# Patient Record
Sex: Female | Born: 1965 | Race: Black or African American | Hispanic: No | Marital: Single | Smoking: Never smoker
Health system: Southern US, Community
[De-identification: ages and names within clinical notes are randomized; demographics above are authoritative.]

## PROBLEM LIST (undated history)

## (undated) DIAGNOSIS — F259 Schizoaffective disorder, unspecified: Secondary | ICD-10-CM

## (undated) DIAGNOSIS — F419 Anxiety disorder, unspecified: Secondary | ICD-10-CM

## (undated) DIAGNOSIS — F1910 Other psychoactive substance abuse, uncomplicated: Secondary | ICD-10-CM

## (undated) DIAGNOSIS — M419 Scoliosis, unspecified: Secondary | ICD-10-CM

## (undated) DIAGNOSIS — J45909 Unspecified asthma, uncomplicated: Secondary | ICD-10-CM

## (undated) DIAGNOSIS — F250 Schizoaffective disorder, bipolar type: Secondary | ICD-10-CM

## (undated) HISTORY — PX: ABDOMINAL HYSTERECTOMY: SHX81

---

## 2016-05-15 DIAGNOSIS — Z139 Encounter for screening, unspecified: Secondary | ICD-10-CM

## 2016-06-14 LAB — GLUCOSE, POCT (MANUAL RESULT ENTRY): POC GLUCOSE: 101 mg/dL — AB (ref 70–99)

## 2016-06-14 NOTE — Congregational Nurse Program (Unsigned)
Congregational Nurse Program Note  Date of Encounter: 05/15/2016  Past Medical History: No past medical history on file.  Encounter Details:    Requested blood sugar check.  CBG 101 - random draw.  Does not have a HCP nor insurance.  Encouraged to make appointment with Lavinia SharpsMary Ann Placey [facility] at [facility]

## 2016-07-03 DIAGNOSIS — Z139 Encounter for screening, unspecified: Secondary | ICD-10-CM

## 2016-07-06 LAB — GLUCOSE, POCT (MANUAL RESULT ENTRY): POC GLUCOSE: 139 mg/dL — AB (ref 70–99)

## 2016-07-06 NOTE — Congregational Nurse Program (Unsigned)
Congregational Nurse Program Note  Date of Encounter: 07/03/2016  Past Medical History: No past medical history on file.  Encounter Details:     CNP Questionnaire - 07/03/16 1501      Patient Demographics   Is this a new or existing patient? Existing   Patient is considered a/an Not Applicable   Race African-American/Black     Patient Assistance   Location of Patient Assistance Not Applicable   Patient's financial/insurance status Low Income;[facility]/Care Connects   Uninsured Patient Yes   Interventions Not Applicable   Patient referred to apply for the following financial assistance Not Applicable   Food insecurities addressed Provided food supplies   Transportation assistance No   Assistance securing medications No   Educational health offerings Navigating the healthcare system;Diabetes     Encounter Details   Primary purpose of visit Education/Health Concerns;Navigating the Healthcare System   Was an Emergency Department visit averted? Not Applicable   Does patient have a medical provider? No   Patient referred to Clinic   Was a mental health screening completed? (GAINS tool) No   Does patient have dental issues? No   Does patient have vision issues? No   Does your patient have an abnormal blood pressure today? No   Since previous encounter, have you referred patient for abnormal blood pressure that resulted in a new diagnosis or medication change? No   Does your patient have an abnormal blood glucose today? No   Since previous encounter, have you referred patient for abnormal blood glucose that resulted in a new diagnosis or medication change? No   Was there a life-saving intervention made? No      B/p and CBG check.

## 2016-07-08 DIAGNOSIS — Z139 Encounter for screening, unspecified: Secondary | ICD-10-CM

## 2016-07-09 LAB — GLUCOSE, POCT (MANUAL RESULT ENTRY): POC Glucose: 137 mg/dl — AB (ref 70–99)

## 2016-07-09 NOTE — Congregational Nurse Program (Unsigned)
Congregational Nurse Program Note  Date of Encounter: 07/08/2016  Past Medical History: No past medical history on file.  Encounter Details:     CNP Questionnaire - 07/08/16 1121      Patient Demographics   Is this a new or existing patient? Existing   Patient is considered a/an Not Applicable   Race African-American/Black     Patient Assistance   Location of Patient Assistance Not Applicable   Patient's financial/insurance status Low Income;[facility]/Care Connects   Uninsured Patient Yes   Interventions Not Applicable   Patient referred to apply for the following financial assistance Not Applicable   Food insecurities addressed Provided food supplies   Transportation assistance No   Assistance securing medications No   Educational health offerings Navigating the healthcare system;Diabetes     Encounter Details   Primary purpose of visit Education/Health Concerns;Navigating the Healthcare System   Was an Emergency Department visit averted? Not Applicable   Does patient have a medical provider? No   Patient referred to Not Applicable   Was a mental health screening completed? (GAINS tool) No   Does patient have dental issues? No   Does patient have vision issues? No   Does your patient have an abnormal blood pressure today? No   Since previous encounter, have you referred patient for abnormal blood pressure that resulted in a new diagnosis or medication change? No   Does your patient have an abnormal blood glucose today? No   Since previous encounter, have you referred patient for abnormal blood glucose that resulted in a new diagnosis or medication change? No   Was there a life-saving intervention made? No      CBG check.  Client states she is "hungry all the time", is "thirsty a lot of the time" and feels tired.  Encouraged client to return to the clinic on Thursday at 8 am for a fasting CBG.  Client agreed

## 2016-07-10 DIAGNOSIS — Z139 Encounter for screening, unspecified: Secondary | ICD-10-CM

## 2016-07-18 LAB — GLUCOSE, POCT (MANUAL RESULT ENTRY): POC Glucose: 90 mg/dl (ref 70–99)

## 2016-07-18 NOTE — Congregational Nurse Program (Unsigned)
Congregational Nurse Program Note  Date of Encounter: 07/10/2016  Past Medical History: No past medical history on file.  Encounter Details:     CNP Questionnaire - 07/10/16 2047      Patient Demographics   Is this a new or existing patient? Existing   Patient is considered a/an Not Applicable   Race African-American/Black     Patient Assistance   Location of Patient Assistance Not Applicable   Patient's financial/insurance status Low Income;[facility] Card/Care Connects   Uninsured Patient Yes   Interventions Not Applicable   Patient referred to apply for the following financial assistance Not Applicable   Food insecurities addressed Provided food supplies   Transportation assistance No   Assistance securing medications No   Educational health offerings Navigating the healthcare system;Diabetes     Encounter Details   Primary purpose of visit Education/Health Concerns;Navigating the Healthcare System   Was an Emergency Department visit averted? Not Applicable   Does patient have a medical provider? No   Patient referred to Not Applicable   Was a mental health screening completed? (GAINS tool) No   Does patient have dental issues? No   Does patient have vision issues? No   Does your patient have an abnormal blood pressure today? No   Since previous encounter, have you referred patient for abnormal blood pressure that resulted in a new diagnosis or medication change? No   Does your patient have an abnormal blood glucose today? No   Since previous encounter, have you referred patient for abnormal blood glucose that resulted in a new diagnosis or medication change? No   Was there a life-saving intervention made? No      CBG check.  Fasting CBG 90

## 2016-08-09 ENCOUNTER — Encounter (HOSPITAL_COMMUNITY): Payer: Self-pay | Admitting: Emergency Medicine

## 2016-08-09 ENCOUNTER — Emergency Department (HOSPITAL_COMMUNITY)
Admission: EM | Admit: 2016-08-09 | Discharge: 2016-08-09 | Disposition: A | Discharge status: Home / Self Care | Payer: Self-pay | Attending: Emergency Medicine | Admitting: Emergency Medicine

## 2016-08-09 DIAGNOSIS — F1410 Cocaine abuse, uncomplicated: Secondary | ICD-10-CM | POA: Insufficient documentation

## 2016-08-09 HISTORY — DX: Other psychoactive substance abuse, uncomplicated: F19.10

## 2016-08-09 LAB — COMPREHENSIVE METABOLIC PANEL
ALK PHOS: 49 U/L (ref 38–126)
ALT: 25 U/L (ref 14–54)
AST: 43 U/L — AB (ref 15–41)
Albumin: 4.1 g/dL (ref 3.5–5.0)
Anion gap: 7 (ref 5–15)
BUN: 19 mg/dL (ref 6–20)
CALCIUM: 9.7 mg/dL (ref 8.9–10.3)
CHLORIDE: 105 mmol/L (ref 101–111)
CO2: 27 mmol/L (ref 22–32)
CREATININE: 1.26 mg/dL — AB (ref 0.44–1.00)
GFR calc Af Amer: 56 mL/min — ABNORMAL LOW (ref >60)
GFR calc non Af Amer: 48 mL/min — ABNORMAL LOW (ref >60)
Glucose, Bld: 98 mg/dL (ref 65–99)
Potassium: 3.4 mmol/L — ABNORMAL LOW (ref 3.5–5.1)
SODIUM: 139 mmol/L (ref 135–145)
Total Bilirubin: 0.7 mg/dL (ref 0.3–1.2)
Total Protein: 7.9 g/dL (ref 6.5–8.1)

## 2016-08-09 LAB — CBC
HEMATOCRIT: 41.3 % (ref 36.0–46.0)
HEMOGLOBIN: 13.9 g/dL (ref 12.0–15.0)
MCH: 30 pg (ref 26.0–34.0)
MCHC: 33.7 g/dL (ref 30.0–36.0)
MCV: 89.2 fL (ref 78.0–100.0)
PLATELETS: 206 10*3/uL (ref 150–400)
RBC: 4.63 MIL/uL (ref 3.87–5.11)
RDW: 12.9 % (ref 11.5–15.5)
WBC: 6.9 10*3/uL (ref 4.0–10.5)

## 2016-08-09 LAB — ETHANOL

## 2016-08-09 NOTE — ED Triage Notes (Signed)
Pt. Stated, Im here for detox of crack. Last used last night. Had 50 dollars worth.Been doing 23 years . [facility]

## 2016-08-09 NOTE — ED Provider Notes (Signed)
[facility]-EMERGENCY DEPT Provider Note   CSN: [id number] Arrival date & time: 08/09/16  1300     History   Chief Complaint Chief Complaint  Patient presents with  . Addiction Problem  . Medical Clearance    HPI  Blood pressure 104/81, pulse 90, temperature 98.1 F (36.7 C), temperature source Oral, resp. rate 18, height 5\' 4"  (1.626 m), weight 51.3 kg, last menstrual period 10/10/2011, SpO2 95 %.  Amanda Keith is a 50 y.o. female questing detox from crack cocaine, she was clean for 4 years and she "fell off the wagon" 3 days ago. She doesn't drink any alcohol and doesn't use any other drugs. She denies any depression, suicidal ideation, homicidal ideation, auditory or visual hallucinations, chest pain, abdominal pain, nausea vomiting. Patient states that she recently moved to the area and does not have a primary care physician.  HPI  Past Medical History:  Diagnosis Date  . Abuse, drug or alcohol     There are no active problems to display for this patient.   History reviewed. No pertinent surgical history.  OB History    No data available       Home Medications    Prior to Admission medications   Not on File    Family History No family history on file.  Social History Social History  Substance Use Topics  . Smoking status: Not on file  . Smokeless tobacco: Never Used  . Alcohol use No     Allergies   Review of patient's allergies indicates not on file.   Review of Systems Review of Systems  10 systems reviewed and found to be negative, except as noted in the HPI.   Physical Exam Updated Vital Signs BP 104/81 (BP Location: Right Arm)   Pulse 90   Temp 98.1 F (36.7 C) (Oral)   Resp 18   Ht 5\' 4"  (1.626 m)   Wt 51.3 kg   LMP 10/10/2011   SpO2 95%   BMI 19.40 kg/m   Physical Exam  Constitutional: She is oriented to person, place, and time. She appears well-developed and well-nourished. No distress.  HENT:  Head: Normocephalic.  Eyes:  Conjunctivae and EOM are normal.  Cardiovascular: Normal rate.   Pulmonary/Chest: Effort normal. No stridor.  Musculoskeletal: Normal range of motion.  Neurological: She is alert and oriented to person, place, and time.  Psychiatric: She has a normal mood and affect. Her speech is normal and behavior is normal. Thought content normal.  Nursing note and vitals reviewed.    ED Treatments / Results  Labs (all labs ordered are listed, but only abnormal results are displayed) Labs Reviewed  COMPREHENSIVE METABOLIC PANEL - Abnormal; Notable for the following:       Result Value   Potassium 3.4 (*)    Creatinine, Ser 1.26 (*)    AST 43 (*)    GFR calc non Af Amer 48 (*)    GFR calc Af Amer 56 (*)    All other components within normal limits  ETHANOL  CBC    EKG  EKG Interpretation None       Radiology No results found.  Procedures Procedures (including critical care time)  Medications Ordered in ED Medications - No data to display   Initial Impression / Assessment and Plan / ED Course  I have reviewed the triage vital signs and the nursing notes.  Pertinent labs & imaging results that were available during my care of the patient were reviewed by me  and considered in my medical decision making (see chart for details).  Clinical Course   Vitals:   08/09/16 1321 08/09/16 1322  BP: 104/81   Pulse: 90   Resp: 18   Temp: 98.1 F (36.7 C)   TempSrc: Oral   SpO2: 95%   Weight:  51.3 kg  Height:  5\' 4"  (1.626 m)     Amanda BlazerSandra Keith is 50 y.o. female requesting detox from crack cocaine, she has been using for several days, she was clean for 3 years before that. No other alcohol or drug use. No other psychiatric issues. Patient afebrile, overall very well appearing. Unfortunately, we cannot offer her inpatient cocaine detox. I have given her a resource guide and asked her friend to help her make phone calls to get the appropriate detox facility.  Evaluation does not show  pathology that would require ongoing emergent intervention or inpatient treatment. Pt is hemodynamically stable and mentating appropriately. Discussed findings and plan with patient/guardian, who agrees with care plan. All questions answered. Return precautions discussed and outpatient follow up given.      Final Clinical Impressions(s) / ED Diagnoses   Final diagnoses:  Cocaine abuse      Amanda Keith [place], PA-C 08/09/16 1551    Arby BarretteMarcy Pfeiffer, MD 08/09/16 2306

## 2016-08-09 NOTE — Care Management Note (Signed)
Case Management Note  Patient Details  Name: Laurine BlazerSandra Babin MRN: [id number] Date of Birth: [birth year]  Subjective/Objective:    50 y.o F seen in the ED 08/09/2016 for Cocaine detox. [place] [facility] [facility] with [facility]. Pt reports she does have [employer].     Action/Plan: [facility] and instructed to call for appt asap. Pt was also [place] [facility] [place] No questions or concerns. Aware hat open hours for clinic are 08:30 Thursdays. No further CM needs at this time              Expected Discharge Date:                  Expected Discharge Plan:  Home/Self Care  In-House Referral:     Discharge planning Services  CM Consult  Post Acute Care Choice:  NA Choice offered to:  Patient  DME Arranged:  N/A DME Agency:  NA  HH Arranged:  NA HH Agency:  NA  Status of Service:  Completed, signed off  If discussed at Long Length of Stay Meetings, dates discussed:    Additional Comments:  Yvone NeuCrutchfield, Amier Hoyt M, RN 08/09/2016, 4:11 PM

## 2016-08-12 ENCOUNTER — Encounter: Payer: Self-pay | Admitting: Pediatric Intensive Care

## 2016-08-12 DIAGNOSIS — Z139 Encounter for screening, unspecified: Secondary | ICD-10-CM

## 2016-08-15 NOTE — Congregational Nurse Program (Signed)
Congregational Nurse Program Note  Date of Encounter: 08/12/2016  Past Medical History: Past Medical History:  Diagnosis Date  . Abuse, drug or alcohol     Encounter Details:     CNP Questionnaire - 08/12/16 1130      Patient Demographics   Is this a new or existing patient? Existing   Patient is considered a/an Not Applicable   Race African-American/Black     Patient Assistance   Location of Patient Assistance [facility]   Patient's financial/insurance status Low Income;[place] Card/Care Connects   Uninsured Patient Yes   Interventions Not Applicable   Patient referred to apply for the following financial assistance Not Applicable   Food insecurities addressed Not Applicable   Transportation assistance No   Assistance securing medications No   Educational health offerings Navigating the healthcare system;Other     Encounter Details   Primary purpose of visit Education/Health Concerns;Navigating the Healthcare System   Was an Emergency Department visit averted? Not Applicable   Does patient have a medical provider? No   Patient referred to Not Applicable   Was a mental health screening completed? (GAINS tool) No   Does patient have dental issues? No   Does patient have vision issues? No   Does your patient have an abnormal blood pressure today? No   Since previous encounter, have you referred patient for abnormal blood pressure that resulted in a new diagnosis or medication change? No   Does your patient have an abnormal blood glucose today? No   Since previous encounter, have you referred patient for abnormal blood glucose that resulted in a new diagnosis or medication change? No   Was there a life-saving intervention made? No      Client states that she has been followed by CN for "pre-diabetes" and would like BP, BG check. Client also states that she would like a glucometer to check BG while she's at rehab. BP 110/70, BG 99. Client's previous BGs have all been WNL. CN will  help client secure a medical home when she finishes rehab. CN explained that client would need to have a more complete physical to determine if she is truly pre-diabetic.

## 2016-08-17 ENCOUNTER — Emergency Department (HOSPITAL_COMMUNITY)
Admission: EM | Admit: 2016-08-17 | Discharge: 2016-08-17 | Disposition: A | Discharge status: Home / Self Care | Payer: Self-pay | Attending: Emergency Medicine | Admitting: Emergency Medicine

## 2016-08-17 ENCOUNTER — Encounter (HOSPITAL_COMMUNITY): Payer: Self-pay | Admitting: Emergency Medicine

## 2016-08-17 DIAGNOSIS — Z79899 Other long term (current) drug therapy: Secondary | ICD-10-CM | POA: Insufficient documentation

## 2016-08-17 DIAGNOSIS — F1910 Other psychoactive substance abuse, uncomplicated: Secondary | ICD-10-CM | POA: Insufficient documentation

## 2016-08-17 DIAGNOSIS — F1490 Cocaine use, unspecified, uncomplicated: Secondary | ICD-10-CM | POA: Insufficient documentation

## 2016-08-17 NOTE — ED Notes (Signed)
Patient ambulatory to lobby. NAD noted.  

## 2016-08-17 NOTE — ED Triage Notes (Signed)
Pt states that she is here for detox from crack cocaine. States she recently relapsed again after being clean for 3 months. States she has been using on and off x 20  Years. Alert and oriented. Denies SI/HI.

## 2016-08-17 NOTE — ED Provider Notes (Signed)
[facility]-EMERGENCY DEPT Provider Note   CSN: [id number] Arrival date & time: 08/17/16  1851     History   Chief Complaint Chief Complaint  Patient presents with  . Addiction Problem    HPI Amanda Keith is a 50 y.o. female.  The history is provided by the patient.   Patient presents here for addiction problems. 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 She is looking for assistance with detox. Currently denies any chest pain, shortness of breath, nausea, vomiting, abdominal pain, urinary symptoms. Denies any SI, HI, ADH.  Past Medical History:  Diagnosis Date  . Abuse, drug or alcohol     There are no active problems to display for this patient.   Past Surgical History:  Procedure Laterality Date  . ABDOMINAL HYSTERECTOMY    . CESAREAN SECTION      OB History    No data available       Home Medications    Prior to Admission medications   Medication Sig Start Date End Date Taking? Authorizing Provider  albuterol (PROVENTIL HFA;VENTOLIN HFA) 108 (90 Base) MCG/ACT inhaler Inhale 1-2 puffs into the lungs every 6 (six) hours as needed for wheezing or shortness of breath.   Yes Historical Provider, MD  atorvastatin (LIPITOR) 10 MG tablet Take 10 mg by mouth daily.   Yes Historical Provider, MD  budesonide-formoterol (SYMBICORT) 80-4.5 MCG/ACT inhaler Inhale 2 puffs into the lungs 2 (two) times daily as needed (for asthma flares).   Yes Historical Provider, MD  Cholecalciferol (VITAMIN D) 2000 units CAPS Take 2,000 Units by mouth daily.   Yes Historical Provider, MD  traZODone (DESYREL) 50 MG tablet Take 50 mg by mouth at bedtime as needed for sleep.   Yes Historical Provider, MD    Family History No family history on file.  Social History Social History  Substance Use Topics  . Smoking status: Not on file  . Smokeless tobacco: Never Used  . Alcohol use No     Allergies   Review of patient's allergies indicates no known allergies.   Review  of Systems Review of Systems  Constitutional: Negative for chills, fatigue and fever.  HENT: Negative for congestion and sore throat.   Eyes: Negative for visual disturbance.  Respiratory: Negative for cough, chest tightness and shortness of breath.   Cardiovascular: Negative for chest pain and palpitations.  Gastrointestinal: Negative for abdominal pain, blood in stool, diarrhea, nausea and vomiting.  Genitourinary: Negative for decreased urine volume and difficulty urinating.  Musculoskeletal: Negative for back pain and neck stiffness.  Skin: Negative for rash.  Neurological: Negative for light-headedness and headaches.  Psychiatric/Behavioral: Negative for confusion.  All other systems reviewed and are negative.    Physical Exam Updated Vital Signs   08/17/16 1859  BP: 102/75  Pulse: 73  Resp: 18  Temp: 98.3 F (36.8 C)  TempSrc: Oral  SpO2: 100%    Physical Exam  Constitutional: She is oriented to person, place, and time. She appears well-developed and well-nourished. No distress.  HENT:  Head: Normocephalic and atraumatic.  Right Ear: External ear normal.  Left Ear: External ear normal.  Nose: Nose normal.  Eyes: Conjunctivae and EOM are normal. No scleral icterus.  Neck: Normal range of motion and phonation normal.  Cardiovascular: Normal rate and regular rhythm.   Pulmonary/Chest: Effort normal. No stridor. No respiratory distress.  Abdominal: She exhibits no distension.  Musculoskeletal: Normal range of motion. She exhibits no edema.  Neurological: She is alert  and oriented to person, place, and time.  Skin: She is not diaphoretic.  Psychiatric: She has a normal mood and affect. Her behavior is normal.  Vitals reviewed.   ED Treatments / Results  Labs (all labs ordered are listed, but only abnormal results are displayed) Labs Reviewed - No data to display  EKG  EKG Interpretation None       Radiology No results found.  Procedures Procedures  (including critical care time)  Medications Ordered in ED Medications - No data to display   Initial Impression / Assessment and Plan / ED Course  I have reviewed the triage vital signs and the nursing notes.  Pertinent labs & imaging results that were available during my care of the patient were reviewed by me and considered in my medical decision making (see chart for details).  Clinical Course    Here for addiction assistance. Patient reports that she is trying to get in today [name] however needs to get her medicine from [facility] which she has not done yet. No SI, HI, ADH. No evidence of withdrawal or severe intoxication. Unfortunately we do not provide detox here however patient was given additional resources for detox centers and shelters.  Safely discharged.  Final Clinical Impressions(s) / ED Diagnoses   Final diagnoses:  Substance abuse   Disposition: Discharge  Condition: Good  I have discussed the results, Dx and Tx plan with the patient who expressed understanding and agree(s) with the plan. Discharge instructions discussed at great length. The patient was given strict return precautions who verbalized understanding of the instructions. No further questions at time of discharge.    Current Discharge Medication List      Follow Up: [facility] [address] [place] [address] [phone] Call  For help establishing care with a care provider      Nira Conn, MD 08/17/16 2231

## 2016-08-17 NOTE — ED Notes (Signed)
Patient reports that she is homeless and has been staying with friends. Pt currently trying to contact boyfriend for a ride.

## 2016-08-19 ENCOUNTER — Encounter: Payer: Self-pay | Admitting: Pediatric Intensive Care

## 2016-08-19 DIAGNOSIS — Z760 Encounter for issue of repeat prescription: Secondary | ICD-10-CM

## 2016-08-19 NOTE — Congregational Nurse Program (Signed)
Congregational Nurse Program Note  Date of Encounter: 08/19/2016  Past Medical History: Past Medical History:  Diagnosis Date  . Abuse, drug or alcohol     Encounter Details:     CNP Questionnaire - 08/19/16 0934      Patient Demographics   Is this a new or existing patient? Existing   Patient is considered a/an Not Applicable   Race African-American/Black     Patient Assistance   Location of Patient Assistance [facility]   Patient's financial/insurance status Low Income;[place] Card/Care Connects   Uninsured Patient Yes   Interventions Assisted patient in making appt.   Patient referred to apply for the following financial assistance Not Applicable   Food insecurities addressed Not Applicable   Transportation assistance No   Assistance securing medications Yes   Type of Assistance Other   Educational health offerings Navigating the healthcare system     Encounter Details   Primary purpose of visit Navigating the Healthcare System   Was an Emergency Department visit averted? Not Applicable   Does patient have a medical provider? Yes   Patient referred to Follow up with established PCP   Was a mental health screening completed? (GAINS tool) No   Does patient have dental issues? No   Does patient have vision issues? No   Does your patient have an abnormal blood pressure today? No   Since previous encounter, have you referred patient for abnormal blood pressure that resulted in a new diagnosis or medication change? No   Does your patient have an abnormal blood glucose today? No   Since previous encounter, have you referred patient for abnormal blood glucose that resulted in a new diagnosis or medication change? No   Was there a life-saving intervention made? No     Client needs assistance with medication refills so she can start rehab. [facility] Client will walk in tomorrow to be seen for refills.

## 2016-12-16 NOTE — Congregational Nurse Program (Signed)
Congregational Nurse Program Note  Date of Encounter: 12/16/2016  Past Medical History: Past Medical History:  Diagnosis Date  . Abuse, drug or alcohol     Encounter Details:     CNP Questionnaire - 12/16/16 1904      Patient Demographics   Is this a new or existing patient? New   Patient is considered a/an Not Applicable   Race African-American/Black     Patient Assistance   Location of Patient Assistance Not Applicable   Patient's financial/insurance status Self-Pay (Uninsured)   Uninsured Patient ([place] Card/Care Connects) Yes   Interventions Counseled to make appt. with provider   Patient referred to apply for the following financial assistance Not Applicable   Food insecurities addressed Not Applicable   Transportation assistance No   Assistance securing medications No   Educational health offerings Navigating the healthcare system     Encounter Details   Primary purpose of visit Acute Illness/Condition Visit;Education/Health Concerns;Navigating the Healthcare System;Spiritual Care/Support Visit   Was an Emergency Department visit averted? Not Applicable   Does patient have a medical provider? No   Patient referred to Establish PCP;Area Agency   Was a mental health screening completed? (GAINS tool) No   Does patient have dental issues? No   Does patient have vision issues? No   Does your patient have an abnormal blood pressure today? No   Since previous encounter, have you referred patient for abnormal blood pressure that resulted in a new diagnosis or medication change? No   Does your patient have an abnormal blood glucose today? No   Since previous encounter, have you referred patient for abnormal blood glucose that resulted in a new diagnosis or medication change? No   Was there a life-saving intervention made? No    Initial visit  to see the  Nurse states she  needs to  get in to  see a oral surgeon ,was referred  To  Adult  Dental and they referred her to see a  oral surgeon that  cant see her  For 3-6 months ,she has mouth pain and  soreness and in constant  Pain. Tried to  Get  An appointment  [facility] was told no  More  Appointments  Available until next  Year . Nurse will help with  This  And  Also offered  Other  practices that  Are taking  New  Clients  ,she   Became  Upset  And  [employer]aid that's  Okay , wants  Something  Done  Now  Appears to  Be  Very  Frustrate  With system ,[facility] states  Nothing  was done  There. Nurse  Told  Client  She  Would  Follow  Up  With  Adult  Dental to see what  can be  done  for  her .  Will try  To  Identify  Needs  And  Get  Back with  client

## 2016-12-20 ENCOUNTER — Encounter (HOSPITAL_COMMUNITY): Payer: Self-pay | Admitting: Emergency Medicine

## 2016-12-20 ENCOUNTER — Emergency Department (HOSPITAL_COMMUNITY)
Admission: EM | Admit: 2016-12-20 | Discharge: 2016-12-23 | Disposition: A | Discharge status: Other Acute Inpatient Hospital | Payer: Self-pay | Attending: Emergency Medicine | Admitting: Emergency Medicine

## 2016-12-20 DIAGNOSIS — J45909 Unspecified asthma, uncomplicated: Secondary | ICD-10-CM | POA: Insufficient documentation

## 2016-12-20 DIAGNOSIS — Z79899 Other long term (current) drug therapy: Secondary | ICD-10-CM | POA: Insufficient documentation

## 2016-12-20 DIAGNOSIS — Z7982 Long term (current) use of aspirin: Secondary | ICD-10-CM | POA: Insufficient documentation

## 2016-12-20 DIAGNOSIS — R45851 Suicidal ideations: Secondary | ICD-10-CM

## 2016-12-20 DIAGNOSIS — F1410 Cocaine abuse, uncomplicated: Secondary | ICD-10-CM | POA: Diagnosis present

## 2016-12-20 DIAGNOSIS — F251 Schizoaffective disorder, depressive type: Secondary | ICD-10-CM | POA: Diagnosis present

## 2016-12-20 HISTORY — DX: Schizoaffective disorder, bipolar type: F25.0

## 2016-12-20 HISTORY — DX: Anxiety disorder, unspecified: F41.9

## 2016-12-20 HISTORY — DX: Unspecified asthma, uncomplicated: J45.909

## 2016-12-20 HISTORY — DX: Schizoaffective disorder, unspecified: F25.9

## 2016-12-20 LAB — CBC
HCT: 39.5 % (ref 36.0–46.0)
HEMOGLOBIN: 13.7 g/dL (ref 12.0–15.0)
MCH: 29.5 pg (ref 26.0–34.0)
MCHC: 34.7 g/dL (ref 30.0–36.0)
MCV: 84.9 fL (ref 78.0–100.0)
Platelets: 247 10*3/uL (ref 150–400)
RBC: 4.65 MIL/uL (ref 3.87–5.11)
RDW: 12.6 % (ref 11.5–15.5)
WBC: 6.3 10*3/uL (ref 4.0–10.5)

## 2016-12-20 LAB — COMPREHENSIVE METABOLIC PANEL
ALK PHOS: 57 U/L (ref 38–126)
ALT: 16 U/L (ref 14–54)
ANION GAP: 12 (ref 5–15)
AST: 26 U/L (ref 15–41)
Albumin: 4.5 g/dL (ref 3.5–5.0)
BUN: 12 mg/dL (ref 6–20)
CALCIUM: 9.4 mg/dL (ref 8.9–10.3)
CO2: 24 mmol/L (ref 22–32)
Chloride: 100 mmol/L — ABNORMAL LOW (ref 101–111)
Creatinine, Ser: 0.99 mg/dL (ref 0.44–1.00)
GFR calc non Af Amer: >60 mL/min (ref >60)
Glucose, Bld: 93 mg/dL (ref 65–99)
Potassium: 3.7 mmol/L (ref 3.5–5.1)
Sodium: 136 mmol/L (ref 135–145)
TOTAL PROTEIN: 8.2 g/dL — AB (ref 6.5–8.1)
Total Bilirubin: 0.7 mg/dL (ref 0.3–1.2)

## 2016-12-20 LAB — RAPID URINE DRUG SCREEN, HOSP PERFORMED
Amphetamines: NOT DETECTED
Barbiturates: NOT DETECTED
Benzodiazepines: NOT DETECTED
COCAINE: POSITIVE — AB
OPIATES: NOT DETECTED
Tetrahydrocannabinol: POSITIVE — AB

## 2016-12-20 LAB — ETHANOL: Alcohol, Ethyl (B): <5 mg/dL (ref <5)

## 2016-12-20 LAB — SALICYLATE LEVEL

## 2016-12-20 LAB — ACETAMINOPHEN LEVEL

## 2016-12-20 MED ORDER — TRAZODONE HCL 50 MG PO TABS
50.0000 mg | ORAL_TABLET | Freq: Every evening | ORAL | Status: DC | PRN
  Administered 2016-12-20: 100 mg via ORAL
  Administered 2016-12-21 – 2016-12-22 (×2): 50 mg via ORAL
  Filled 2016-12-20 (×4): qty 1

## 2016-12-20 MED ORDER — RISPERIDONE 1 MG PO TABS
1.0000 mg | ORAL_TABLET | Freq: Every day | ORAL | Status: DC
Start: 2016-12-20 — End: 2016-12-23
  Administered 2016-12-20 – 2016-12-22 (×3): 1 mg via ORAL
  Filled 2016-12-20 (×3): qty 1

## 2016-12-20 MED ORDER — ALBUTEROL SULFATE HFA 108 (90 BASE) MCG/ACT IN AERS: 1.0000 | INHALATION_SPRAY | Freq: Four times a day (QID) | RESPIRATORY_TRACT | Status: DC | PRN

## 2016-12-20 MED ORDER — MOMETASONE FURO-FORMOTEROL FUM 100-5 MCG/ACT IN AERO
2.0000 | INHALATION_SPRAY | Freq: Two times a day (BID) | RESPIRATORY_TRACT | Status: DC
  Administered 2016-12-20 – 2016-12-23 (×6): 2 via RESPIRATORY_TRACT
  Filled 2016-12-20: qty 8.8

## 2016-12-20 MED ORDER — GABAPENTIN 300 MG PO CAPS
300.0000 mg | ORAL_CAPSULE | Freq: Every day | ORAL | Status: DC
  Administered 2016-12-20 – 2016-12-22 (×3): 300 mg via ORAL
  Filled 2016-12-20 (×3): qty 1

## 2016-12-20 NOTE — ED Notes (Signed)
TTS at bedside. 

## 2016-12-20 NOTE — ED Notes (Signed)
Bed: WA33 Expected date:  Expected time:  Means of arrival:  Comments: 
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
## 2016-12-20 NOTE — ED Notes (Signed)
Pt changing into scrubs 

## 2016-12-20 NOTE — ED Notes (Signed)
Pt admitted to room #43. Pt reports depression and hopelessness. Pt reports she "hurts inside." Pt endorsing SI without plan.  Pt denies HI. Pt endorsing AVH. Pt reports hearing voices that say "negative stuff." and seeing "shadows" Pt identifies stressor as going through a divorce. Pt reports she has been off her medication for 2 weeks. Pt denies hx of violence. Encouragement and support provided. Special checks q 15 mins in place for safety. Video monitoring in place.

## 2016-12-20 NOTE — ED Notes (Signed)
SBAR Report received from previous nurse. Pt received calm and visible on unit. Pt denies current SI/ HI, A/V H, depression, anxiety, or pain at this time, and appears otherwise stable and free of distress. Pt reminded of camera surveillance, q 15 min rounds, and rules of the milieu. Will continue to assess. 
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
## 2016-12-20 NOTE — BH Assessment (Signed)
Assessment completed. Consulted with Dr. Jannifer FranklinAkintayo who recommends inpatient treatment at this time.   Amanda PokeJoVea Kc Sedlak, LCSW Therapeutic Triage Specialist [facility] 12/20/2016 12:39 PM

## 2016-12-20 NOTE — ED Triage Notes (Addendum)
Pt verbalizes SI without plan. Has been off of mediations for a week. Pt tearful with triage.

## 2016-12-21 DIAGNOSIS — F251 Schizoaffective disorder, depressive type: Secondary | ICD-10-CM | POA: Diagnosis present

## 2016-12-21 DIAGNOSIS — F1410 Cocaine abuse, uncomplicated: Secondary | ICD-10-CM | POA: Diagnosis present

## 2016-12-21 NOTE — ED Notes (Signed)
Re-faxing papers to magistrate for IVC as pt has not been served with papers at this time/ at this time.

## 2016-12-21 NOTE — Progress Notes (Signed)
CSW faxed patient's IVC paperwork to magistrate office. CSW attempted to contact Magistrate office to confirm IVC received, phone not working. CSW provided patient's RN with patient's IVC paperwork.

## 2016-12-21 NOTE — BHH Counselor (Signed)
[name] [facility]    Gwinda Passereylese D Bennett, MS, [facility], [facility] Triage Specialist [phone]

## 2016-12-21 NOTE — ED Notes (Signed)
SBAR Report received from previous nurse. Pt received calm and visible on unit. Pt denies current SI/ HI, A/V H, depression, anxiety, or pain at this time, and appears otherwise stable and free of distress. Pt reminded of camera surveillance, q 15 min rounds, and rules of the milieu. Will continue to assess. 

## 2016-12-21 NOTE — ED Notes (Signed)
Report given 1050 to [facility] RN, who called back later to say that Dr. Enedina FinnerGoli, accepting physician, required that pt be IVC'd for transfer. Social worker notified.

## 2016-12-21 NOTE — BH Assessment (Signed)
Contacted [facility] at [phone] to inform them that patient has not yet been served and asked if her bed will be held until tomorrow. Verlon AuLeslie states that patient can come tomorrow 12/22/2016 after 12pm. Requested that transport is not called until around 10 due to back log of admissions due to weather conditions in the area and ongoing admissions.   Informed patients nurse of this information.   Davina PokeJoVea Ayiana Winslett, LCSW Therapeutic Triage Specialist [facility] 12/21/2016 7:14 PM

## 2016-12-21 NOTE — BHH Counselor (Signed)
Received a phone call from Casimiro NeedleMichael with [facility] who states he needs the paperwork for the pt to be refaxed to [phone] due to it coming together with another pt's. TTS notified to resend the paperwork.  Princess BruinsAquicha Duff, MSW, Theresia MajorsLCSWA

## 2016-12-21 NOTE — BHH Counselor (Addendum)
Pt has been accepted after 10:00 on [facility]/16 [facility] per Casimiro NeedleMichael. Accepting Doctor is  Arts administratorVeeraindar Goli. Pt accepted to room 363. Call to report is [phone], call after 08:30 to report. Darel HongJudy, RN has been notified.   Princess BruinsAquicha Duff, MSW, Theresia MajorsLCSWA

## 2016-12-21 NOTE — ED Notes (Signed)
Pt has been accepted at [facility]. Pt informed that she will not be going till morning, and was agreeable once the situation was explained to her. Pt took her medication without trouble and is currently sleeping.

## 2016-12-21 NOTE — ED Notes (Signed)
Pt IVC served by [employer].

## 2016-12-21 NOTE — BHH Counselor (Signed)
Pt has been referred to the following inpt hospitals: [facility], [facility], [facility], [facility], [facility], [address], [facility], [facility], [facility], [facility], [place].  Princess BruinsAquicha Duff, MSW, Theresia MajorsLCSWA

## 2016-12-21 NOTE — BH Assessment (Signed)
[employer] states that magistrates office phones are down and requests to fax a cover sheet with request to return call.   TTS counselor faxed cover sheet requesting that the magistrates office contact [phone] regarding the patients IVC. Patients nurse informed that magistrate will call regarding IVC and requested that she ask if the IVC was received or if the IVC should be refaxed.   Davina PokeJoVea Burhanuddin Kohlmann, LCSW Therapeutic Triage Specialist [facility] 12/21/2016 7:03 PM

## 2016-12-22 DIAGNOSIS — F251 Schizoaffective disorder, depressive type: Secondary | ICD-10-CM

## 2016-12-22 DIAGNOSIS — Z79899 Other long term (current) drug therapy: Secondary | ICD-10-CM

## 2016-12-22 DIAGNOSIS — Z90710 Acquired absence of both cervix and uterus: Secondary | ICD-10-CM

## 2016-12-22 DIAGNOSIS — R45851 Suicidal ideations: Secondary | ICD-10-CM

## 2016-12-22 DIAGNOSIS — F1410 Cocaine abuse, uncomplicated: Secondary | ICD-10-CM

## 2016-12-22 MED ORDER — IBUPROFEN 800 MG PO TABS
800.0000 mg | ORAL_TABLET | Freq: Three times a day (TID) | ORAL | Status: DC | PRN
  Administered 2016-12-22 – 2016-12-23 (×2): 800 mg via ORAL
  Filled 2016-12-22 (×2): qty 1

## 2016-12-22 NOTE — ED Notes (Signed)
Pt c/o back pain this nurse notified Jameson,NP.

## 2016-12-22 NOTE — ED Notes (Signed)
Pt cooperative, pleasant on approach. Reporting depression, endorsing passive SI. Encouragement and support provided. Will continue to monitor on special checks q 15 mins for safety.Video monitoring in place.

## 2016-12-22 NOTE — ED Notes (Signed)
SBAR Report received from previous nurse. Pt received calm and visible on unit. Pt denies current SI/ HI, A/V H, depression, anxiety, or pain at this time, and appears otherwise stable and free of distress. pt tearful during assessment and unsure of future asking writer about resources after discharge. Pt reminded of camera surveillance, q 15 min rounds, and rules of the milieu. Will continue to assess.

## 2016-12-22 NOTE — BHH Counselor (Signed)
Per Darel HongJudy, RN [facility]'s [facility].   Gwinda Passereylese D Bennett, MS, [facility], [facility] Triage Specialist [phone]

## 2016-12-22 NOTE — BH Assessment (Signed)
BHH Assessment Progress Note  Per Thedore MinsMojeed Akintayo, MD, this pt requires psychiatric hospitalization at this time.  Pt presents under IVC.  Per morning shift report, [facility] Dr Enedina FinnerGoli.  Dr Jannifer FranklinAkintayo concurs with this decision.  [name] nurse, Morrie Sheldonshley, has been notified, and agrees to call report to [phone].  [facility]  Doylene Canninghomas Jim Lundin, MA Triage Specialist [phone]

## 2016-12-22 NOTE — Progress Notes (Signed)
12/22/16 1403:  LRT introduced self to pt and offered activities.  Pt stated she just had some medication and wanted to get some rest.  Caroll RancherMarjette Pegah Segel, LRT/CTRS

## 2016-12-23 NOTE — ED Notes (Signed)
Pt provided sandwich and coffee for the 3 hour drive to [place].
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
## 2016-12-23 NOTE — ED Notes (Addendum)
Sheriff here to pick up pt for transport. Orders received to discharge patient. Pt aware and discharge summery reviewed with patient as well as review of medications. All personal items return and patient signed all discharge paperwork.

## 2016-12-29 NOTE — Congregational Nurse Program (Signed)
Congregational Nurse Program Note  Date of Encounter: 12/17/2016  Past Medical History: Past Medical History:  Diagnosis Date  . Abuse, drug or alcohol   . Anxiety   . Asthma   . Schizo affective schizophrenia [facility](HCC)     Encounter Details:     CNP Questionnaire - 12/29/16 2226      Patient Demographics   Is this a new or existing patient? Existing   Patient is considered a/an Not Applicable   Race African-American/Black     Patient Assistance   Location of Patient Assistance Not Applicable   Patient's financial/insurance status Self-Pay (Uninsured)   Uninsured Patient ([place] Card/Care Connects) Yes   Interventions Counseled to make appt. with provider;Follow-up/Education/Support provided after completed appt.   Patient referred to apply for the following financial assistance Not Applicable   Food insecurities addressed Not Applicable   Transportation assistance No   Assistance securing medications No   Educational health offerings Navigating the healthcare system     Encounter Details   Primary purpose of visit Navigating the Healthcare System;Spiritual Care/Support Visit;Education/Health Concerns   Was an Emergency Department visit averted? Not Applicable   Does patient have a medical provider? Yes   Patient referred to Follow up with established PCP;Area Agency   Was a mental health screening completed? (GAINS tool) No   Does patient have dental issues? Yes   Was a dental referral made? No resources for a referral   Does patient have vision issues? No   Does your patient have an abnormal blood pressure today? No   Since previous encounter, have you referred patient for abnormal blood pressure that resulted in a new diagnosis or medication change? No   Does your patient have an abnormal blood glucose today? No   Since previous encounter, have you referred patient for abnormal blood glucose that resulted in a new diagnosis or medication change? No     Nurse  Made   Several  Telephone  Calls  To  Determine if dates  For  Dental care  Could  Be done  Earlier ,unable to get a response ,[facility]  [facility]  To see if  I  Can get  Some assistance with situation.  Told  Client in the  Hallway that I was working on issue.

## 2017-01-03 ENCOUNTER — Encounter (HOSPITAL_COMMUNITY): Payer: Self-pay | Admitting: Emergency Medicine

## 2017-01-03 ENCOUNTER — Emergency Department (HOSPITAL_COMMUNITY): Payer: Self-pay

## 2017-01-03 ENCOUNTER — Emergency Department (HOSPITAL_COMMUNITY)
Admission: EM | Admit: 2017-01-03 | Discharge: 2017-01-04 | Disposition: A | Discharge status: Home / Self Care | Payer: Self-pay | Attending: Emergency Medicine | Admitting: Emergency Medicine

## 2017-01-03 DIAGNOSIS — F17200 Nicotine dependence, unspecified, uncomplicated: Secondary | ICD-10-CM | POA: Insufficient documentation

## 2017-01-03 DIAGNOSIS — G8929 Other chronic pain: Secondary | ICD-10-CM | POA: Insufficient documentation

## 2017-01-03 DIAGNOSIS — M545 Low back pain, unspecified: Secondary | ICD-10-CM

## 2017-01-03 DIAGNOSIS — J45909 Unspecified asthma, uncomplicated: Secondary | ICD-10-CM | POA: Insufficient documentation

## 2017-01-03 DIAGNOSIS — Z7982 Long term (current) use of aspirin: Secondary | ICD-10-CM | POA: Insufficient documentation

## 2017-01-03 HISTORY — DX: Scoliosis, unspecified: M41.9

## 2017-01-03 LAB — URINALYSIS, ROUTINE W REFLEX MICROSCOPIC
BILIRUBIN URINE: NEGATIVE
Bacteria, UA: NONE SEEN
Glucose, UA: NEGATIVE mg/dL
HGB URINE DIPSTICK: NEGATIVE
Ketones, ur: 80 mg/dL — AB
LEUKOCYTES UA: NEGATIVE
Nitrite: NEGATIVE
PH: 5 (ref 5.0–8.0)
Protein, ur: 30 mg/dL — AB
SPECIFIC GRAVITY, URINE: 1.026 (ref 1.005–1.030)
Squamous Epithelial / LPF: NONE SEEN

## 2017-01-03 MED ORDER — KETOROLAC TROMETHAMINE 30 MG/ML IJ SOLN
30.0000 mg | Freq: Once | INTRAMUSCULAR | Status: AC
  Administered 2017-01-03: 30 mg via INTRAMUSCULAR
  Filled 2017-01-03: qty 1

## 2017-01-03 NOTE — ED Provider Notes (Signed)
[facility]-EMERGENCY DEPT Provider Note   CSN: [id number] Arrival date & time: 01/03/17  1921  By signing my name below, I, Amanda Keith, attest that this documentation has been prepared under the direction and in the presence of Sharen Hecklaudia Gracelyn Coventry, PA-C.  Electronically Signed: Rosario AdieWilliam Andrew Keith, ED Scribe. 01/03/17. 11:05 PM.  History   Chief Complaint Chief Complaint  Patient presents with  . Back Pain   The history is provided by the patient. No language interpreter was used.    HPI Comments: Amanda Keith is a 51 y.o. female with a h/o scoliosis, who presents to the Emergency Department complaining of intermittent, bilateral lower back pain onset several years ago, worsening and constant since three days ago. Pt describes her pain as intermittently aching and occasionally sharp. No radiation of pain to lower extremities. No recent falls, injury, or trauma to the back. Pt reports that she stands frequently for work, but does not routinely heavy lift. Pt has been taking 800mg  Ibuprofen and analgesic topical creme at home with minimal relief of her pain. Pt is not currently followed by a spinal or ortho specialist. She denies abdominal pain, fever, urgency, frequency, hematuria, dysuria, difficulty urinating, numbness/weakness, saddle anaesthesia/paraesthesias in lower extremities, bowel/bladder incontinence, or any other associated symptoms.   Past Medical History:  Diagnosis Date  . Abuse, drug or alcohol   . Anxiety   . Asthma   . Schizo affective schizophrenia (HCC)   . Scoliosis    Patient Active Problem List   Diagnosis Date Noted  . Schizoaffective disorder, depressive type (HCC) 12/21/2016  . Cocaine abuse 12/21/2016   Past Surgical History:  Procedure Laterality Date  . ABDOMINAL HYSTERECTOMY    . CESAREAN SECTION     OB History    No data available     Home Medications    Prior to Admission medications   Medication Sig Start Date End Date Taking? Authorizing  Provider  albuterol (PROVENTIL HFA;VENTOLIN HFA) 108 (90 Base) MCG/ACT inhaler Inhale 1-2 puffs into the lungs every 6 (six) hours as needed for wheezing or shortness of breath.    Historical Provider, MD  Aspirin-Caffeine 500-32.5 MG TABS Take 1 tablet by mouth daily as needed (scoliosis pain).    Historical Provider, MD  budesonide-formoterol (SYMBICORT) 80-4.5 MCG/ACT inhaler Inhale 2 puffs into the lungs 2 (two) times daily as needed (for asthma flares).    Historical Provider, MD  diclofenac sodium (VOLTAREN) 1 % GEL Apply 2 g topically 4 (four) times daily. 01/04/17   Liberty Handylaudia J Roma Bierlein, PA-C  gabapentin (NEURONTIN) 300 MG capsule Take 300 mg by mouth at bedtime.     Historical Provider, MD  methocarbamol (ROBAXIN) 500 MG tablet Take 1 tablet (500 mg total) by mouth 2 (two) times daily. 01/04/17   Liberty Handylaudia J Carmin Alvidrez, PA-C  naproxen (NAPROSYN) 500 MG tablet Take 1 tablet (500 mg total) by mouth 2 (two) times daily. 01/04/17   Liberty Handylaudia J Venetta Knee, PA-C  oxyCODONE-acetaminophen (PERCOCET/ROXICET) 5-325 MG tablet Take 2 tablets by mouth every 4 (four) hours as needed for severe pain. 01/04/17   Liberty Handylaudia J Ott Zimmerle, PA-C  risperiDONE (RISPERDAL) 1 MG tablet Take 1 mg by mouth at bedtime.     Historical Provider, MD  traZODone (DESYREL) 50 MG tablet Take 50 mg by mouth at bedtime as needed for sleep.    Historical Provider, MD  trolamine salicylate (ASPERCREME) 10 % cream Apply 1 application topically as needed for muscle pain.    Historical Provider, MD   [facility]  [facility] No family history on file.  Social History Social History  Substance Use Topics  . Smoking status: Current Every Day Smoker  . Smokeless tobacco: Never Used  . Alcohol use No   Allergies   Patient has no known allergies.  Review of Systems Review of Systems  Constitutional: Negative for fever.  HENT: Negative for congestion and sore throat.   Eyes: Negative for visual disturbance.  Respiratory: Negative for cough and shortness  of breath.   Cardiovascular: Negative for chest pain.  Gastrointestinal: Negative for abdominal pain, constipation, diarrhea, nausea and vomiting.  Genitourinary: Negative for difficulty urinating, dysuria, frequency, hematuria and urgency.  Musculoskeletal: Positive for back pain (lower) and myalgias.  Neurological: Negative for dizziness, weakness, light-headedness, numbness and headaches.       Negative for saddle anaesthesia/paraesthesias. Negative for bowel/bladder incontinence.    Physical Exam Updated Vital Signs BP 103/62   Pulse 105   Temp 97.7 F (36.5 C) (Oral)   Resp 16   Ht 5\' 4"  (1.626 m)   Wt 56.2 kg   LMP 10/10/2011   SpO2 100%   BMI 21.28 kg/m   Physical Exam  Constitutional: She is oriented to person, place, and time. She appears well-developed and well-nourished. No distress.  NAD.  HENT:  Head: Normocephalic and atraumatic.  Right Ear: External ear normal.  Left Ear: External ear normal.  Nose: Nose normal.  Mouth/Throat: Oropharynx is clear and moist. No oropharyngeal exudate.  Moist mucous membranes.  No nasal mucosa edema. Non-bulging, pearly gray TMs with cone of light without lesions or erythema. No middle ear erythema or edema.   Eyes: Conjunctivae and EOM are normal. Pupils are equal, round, and reactive to light. No scleral icterus.  Neck: Normal range of motion. Neck supple. No JVD present.  Cardiovascular: Normal rate, regular rhythm and normal heart sounds.   No murmur heard. Pulmonary/Chest: Effort normal and breath sounds normal. She has no wheezes.  Abdominal: Soft. There is no tenderness.  No CVAT bilaterally. No suprapubic tenderness. Negative Murphy's. Negative McBurney's. Negative Psoas sign.  Musculoskeletal: Normal range of motion. She exhibits no deformity.  Normal gait. Back: There is bilateral lumbar paraspinal muscular tenderness. No midline CTL spine tenderness. Bilateral SI joint tenderness. No sciatic notch tenderness  bilaterally. Full ROM of the lumbar spine. Negative straight leg raise and Faber test bilaterally.   Lymphadenopathy:    She has no cervical adenopathy.  Neurological: She is alert and oriented to person, place, and time.  5/5 strength at bilateral hips, knees, ankles. Sensation to light touch intact in lower extremities. No groin numbness.   Skin: Skin is warm and dry. Capillary refill takes less than 2 seconds.  Psychiatric: She has a normal mood and affect. Her behavior is normal. Judgment and thought content normal.  Nursing note and vitals reviewed.  ED Treatments / Results  DIAGNOSTIC STUDIES: Oxygen Saturation is 100% on RA, normal by my interpretation.   COORDINATION OF CARE: 11:05 PM-Discussed next steps with pt including DG L spine. Pt verbalized understanding and is agreeable with the plan.   Labs (all labs ordered are listed, but only abnormal results are displayed) Labs Reviewed  URINALYSIS, ROUTINE W REFLEX MICROSCOPIC - Abnormal; Notable for the following:       Result Value   Ketones, ur 80 (*)    Protein, ur 30 (*)    All other components within normal limits   EKG  EKG Interpretation None      Radiology Dg Lumbar  Spine Complete  Result Date: 01/04/2017 CLINICAL DATA:  Worsening low back pain EXAM: LUMBAR SPINE - COMPLETE 4+ VIEW COMPARISON:  None. FINDINGS: Mild dextroscoliosis of the lumbar spine. 5 non rib-bearing lumbar type vertebra. Multiple calcified pelvic phleboliths. Moderate narrowing at L5-S1. Lumbar alignment within normal limits. Vertebral body heights are normal. IMPRESSION: No acute osseous abnormality Electronically Signed   By: Jasmine Pang M.D.   On: 01/04/2017 00:37    Procedures Procedures   Medications Ordered in ED Medications  ketorolac (TORADOL) 30 MG/ML injection 30 mg (30 mg Intramuscular Given 01/03/17 2329)    Initial Impression / Assessment and Plan / ED Course  I have reviewed the triage vital signs and the nursing  notes.  Pertinent labs & imaging results that were available during my care of the patient were reviewed by me and considered in my medical decision making (see chart for details).     Patient is a 50yoF with a hx of scoliosis who presents to the ED with back pain. No neurological deficits appreciated. Patient is ambulatory. No warning symptoms of back pain including: fecal incontinence, urinary retention or overflow incontinence, night sweats, waking from sleep with back pain, unexplained fevers, recent trauma. No concern for cauda equina, epidural abscess, or other serious cause of back pain. Lumbar x-rays negative for acute injuries.  Urinalysis remarkable for ketones.  When asked about fluid intake pt states she has always been told she has ketones in her urine, states she "barely drink water" during the day.  Pt states she drink 1L sweet tea daily at most.  Pt denies h/o DM. Pt denies abdominal pain, n/v/d/c, urinary symptoms. No polypsia, polyuria.  No previous kidney insufficiency. Conservative measures such as rest, ice/heat, muscle relaxers and short course of percocet indicated with PCP follow-up if no improvement with conservative management.  [place] narcotic data based used, patient not found to recently be rx narcotics. Encouraged patient to drink a minimum of 2-3 L of water throughout the day.   Final Clinical Impressions(s) / ED Diagnoses   Final diagnoses:  Chronic bilateral low back pain without sciatica   New Prescriptions Discharge Medication List as of 01/04/2017  1:06 AM    START taking these medications   Details  diclofenac sodium (VOLTAREN) 1 % GEL Apply 2 g topically 4 (four) times daily., Starting Sun 01/04/2017, Print    methocarbamol (ROBAXIN) 500 MG tablet Take 1 tablet (500 mg total) by mouth 2 (two) times daily., Starting Sun 01/04/2017, Print    naproxen (NAPROSYN) 500 MG tablet Take 1 tablet (500 mg total) by mouth 2 (two) times daily., Starting Sun 01/04/2017, Print     oxyCODONE-acetaminophen (PERCOCET/ROXICET) 5-325 MG tablet Take 2 tablets by mouth every 4 (four) hours as needed for severe pain., Starting Sun 01/04/2017, Print       I personally performed the services described in this documentation, which was scribed in my presence. The recorded information has been reviewed and is accurate.    Liberty Handy, PA-C 01/04/17 0249    Shaune Pollack, MD 01/04/17 1235

## 2017-01-03 NOTE — ED Triage Notes (Signed)
Patient reports low back pain onset this week , denies injury or fall / no urinary discomfort , pt. suspects pain stems from her scoliosis .

## 2017-01-04 MED ORDER — DICLOFENAC SODIUM 1 % TD GEL: 2.0000 g | Freq: Four times a day (QID) | TRANSDERMAL | 0 refills | Status: DC

## 2017-01-04 MED ORDER — OXYCODONE-ACETAMINOPHEN 5-325 MG PO TABS: 2.0000 | ORAL_TABLET | ORAL | 0 refills | Status: DC | PRN

## 2017-01-04 MED ORDER — METHOCARBAMOL 500 MG PO TABS: 500.0000 mg | ORAL_TABLET | Freq: Two times a day (BID) | ORAL | 0 refills | Status: DC

## 2017-01-04 MED ORDER — NAPROXEN 500 MG PO TABS: 500.0000 mg | ORAL_TABLET | Freq: Two times a day (BID) | ORAL | 0 refills | Status: DC

## 2017-01-04 NOTE — Discharge Instructions (Signed)
The x-rays we took today did not show any new injuries to your low back.Your low back pain is most likely due to muscular spasms. You have been prescribed Voltaren gel that you can apply to the affected area up to 4 times a day for inflammation and pain. You have been prescribed Robaxin which is a muscle relaxer please take this medication as needed and avoid drinking alcohol or driving a vehicle immediately after taking this medication as it may cause drowsiness. You have been prescribed a short course of Percocet for the next couple of days, please take this medication as prescribed for severe low back pain. You've also been prescribed naproxen which is an anti-inflammatory and and tiny pain medication, please take this medication as prescribed. Please repeat the attached information on chronic back pain and what you need to know about prescription opioid pain medication and chronic back pain. Return to the emergency department if your low back pain worsens or if it's associated with fever, abdominal pain, no groin numbness, bladder incontinence or retention. Your urine showed that you are dehydrated, as discussed you need to drink at least 3 L of water daily.

## 2017-01-15 NOTE — Congregational Nurse Program (Signed)
Congregational Nurse Program Note  Date of Encounter: 01/07/2017  Past Medical History: Past Medical History:  Diagnosis Date  . Abuse, drug or alcohol   . Anxiety   . Asthma   . Schizo affective schizophrenia (HCC)   . Scoliosis     Encounter Details:     CNP Questionnaire - 01/07/17 1010      Patient Demographics   Is this a new or existing patient? Existing   Patient is considered a/an Not Applicable   Race African-American/Black     Patient Assistance   Location of Patient Assistance Not Applicable   Patient's financial/insurance status Self-Pay (Uninsured);Low Income   Uninsured Patient ([place]s) Yes   Interventions Assisted patient in making appt.;Appt. has been completed   Patient referred to apply for the following financial assistance Not Applicable   Food insecurities addressed Not Applicable   Transportation assistance No   Assistance securing medications Yes   Type of Assistance [facility]   Educational health offerings Navigating the healthcare system;Medications     Encounter Details   Primary purpose of visit Navigating the Healthcare System;Acute Illness/Condition Visit;Education/Health Concerns   Was an Emergency Department visit averted? Not Applicable   Does patient have a medical provider? Yes   Patient referred to Clinic   Was a mental health screening completed? (GAINS tool) No   Does patient have dental issues? Yes   Was a dental referral made? No resources for a referral   Does patient have vision issues? No   Does your patient have an abnormal blood pressure today? No   Since previous encounter, have you referred patient for abnormal blood pressure that resulted in a new diagnosis or medication change? No   Does your patient have an abnormal blood glucose today? No   Since previous encounter, have you referred patient for abnormal blood glucose that resulted in a new diagnosis or medication change? No   Was there a  life-saving intervention made? No     Needed assistance with obtaining medications following ED visit.  Pres[facility] and delivered to client

## 2017-02-09 ENCOUNTER — Encounter (HOSPITAL_COMMUNITY): Payer: Self-pay

## 2017-02-09 ENCOUNTER — Emergency Department (HOSPITAL_COMMUNITY)
Admission: EM | Admit: 2017-02-09 | Discharge: 2017-02-10 | Disposition: A | Discharge status: Home / Self Care | Payer: Self-pay | Attending: Emergency Medicine | Admitting: Emergency Medicine

## 2017-02-09 DIAGNOSIS — R112 Nausea with vomiting, unspecified: Secondary | ICD-10-CM | POA: Insufficient documentation

## 2017-02-09 DIAGNOSIS — J45909 Unspecified asthma, uncomplicated: Secondary | ICD-10-CM | POA: Insufficient documentation

## 2017-02-09 DIAGNOSIS — Z7982 Long term (current) use of aspirin: Secondary | ICD-10-CM | POA: Insufficient documentation

## 2017-02-09 LAB — I-STAT CHEM 8, ED
BUN: 18 mg/dL (ref 6–20)
CALCIUM ION: 1.12 mmol/L — AB (ref 1.15–1.40)
Chloride: 104 mmol/L (ref 101–111)
Creatinine, Ser: 1 mg/dL (ref 0.44–1.00)
Glucose, Bld: 98 mg/dL (ref 65–99)
HEMATOCRIT: 40 % (ref 36.0–46.0)
HEMOGLOBIN: 13.6 g/dL (ref 12.0–15.0)
Potassium: 3.5 mmol/L (ref 3.5–5.1)
SODIUM: 141 mmol/L (ref 135–145)
TCO2: 24 mmol/L (ref 0–100)

## 2017-02-09 MED ORDER — ONDANSETRON 4 MG PO TBDP
4.0000 mg | ORAL_TABLET | Freq: Once | ORAL | Status: AC
  Administered 2017-02-09: 4 mg via ORAL

## 2017-02-09 MED ORDER — FAMOTIDINE IN NACL 20-0.9 MG/50ML-% IV SOLN
20.0000 mg | Freq: Once | INTRAVENOUS | Status: AC
  Administered 2017-02-09: 20 mg via INTRAVENOUS
  Filled 2017-02-09: qty 50

## 2017-02-09 MED ORDER — ONDANSETRON 4 MG PO TBDP
ORAL_TABLET | ORAL | Status: AC
  Filled 2017-02-09: qty 1

## 2017-02-09 MED ORDER — SODIUM CHLORIDE 0.9 % IV SOLN
INTRAVENOUS | Status: AC
  Administered 2017-02-09: 22:00:00 via INTRAVENOUS

## 2017-02-09 MED ORDER — METOCLOPRAMIDE HCL 5 MG/ML IJ SOLN
10.0000 mg | Freq: Once | INTRAMUSCULAR | Status: AC
  Administered 2017-02-09: 10 mg via INTRAVENOUS
  Filled 2017-02-09: qty 2

## 2017-02-09 NOTE — ED Provider Notes (Signed)
[facility]-EMERGENCY DEPT Provider Note   CSN: [id number] Arrival date & time: 02/09/17  2011  By signing my name below, I, Modena Jansky, attest that this documentation has been prepared under the direction and in the presence of non-physician practitioner, Kerrie Buffalo, NP. Electronically Signed: Modena Jansky, Scribe. 02/09/2017. 11:22 PM.  History   Chief Complaint No chief complaint on file.  The history is provided by the patient. No language interpreter was used.  Emesis   This is a new problem. The current episode started 12 to 24 hours ago. Episode frequency: Once. The problem has not changed since onset.There has been no fever. Associated symptoms include diarrhea. Pertinent negatives include no abdominal pain, no cough and no fever. Risk factors include suspect food intake.   HPI Comments: Amanda Keith is a 51 y.o. female who presents to the Emergency Department complaining of intermittent vomiting that started last night. She states had one episode of vomiting last night after eating chili. She is currently nauseous. She was given zofran in the ED with some relief. She reports associated diarrhea (2 episodes). She denies any sore throat, ear pain, urinary symptoms, or other complaints.   Past Medical History:  Diagnosis Date  . Abuse, drug or alcohol   . Anxiety   . Asthma   . Schizo affective schizophrenia (HCC)   . Scoliosis     Patient Active Problem List   Diagnosis Date Noted  . Schizoaffective disorder, depressive type (HCC) 12/21/2016  . Cocaine abuse 12/21/2016    Past Surgical History:  Procedure Laterality Date  . ABDOMINAL HYSTERECTOMY    . CESAREAN SECTION      OB History    No data available       Home Medications    Prior to Admission medications   Medication Sig Start Date End Date Taking? Authorizing Provider  albuterol (PROVENTIL HFA;VENTOLIN HFA) 108 (90 Base) MCG/ACT inhaler Inhale 1-2 puffs into the lungs every 6 (six) hours as needed for  wheezing or shortness of breath.    Historical Provider, MD  Aspirin-Caffeine 500-32.5 MG TABS Take 1 tablet by mouth daily as needed (scoliosis pain).    Historical Provider, MD  budesonide-formoterol (SYMBICORT) 80-4.5 MCG/ACT inhaler Inhale 2 puffs into the lungs 2 (two) times daily as needed (for asthma flares).    Historical Provider, MD  diclofenac sodium (VOLTAREN) 1 % GEL Apply 2 g topically 4 (four) times daily. 01/04/17   Liberty Handy, PA-C  famotidine (PEPCID) 20 MG tablet Take 1 tablet (20 mg total) by mouth 2 (two) times daily. 02/10/17   Alisabeth Selkirk Orlene Och, NP  gabapentin (NEURONTIN) 300 MG capsule Take 300 mg by mouth at bedtime.     Historical Provider, MD  methocarbamol (ROBAXIN) 500 MG tablet Take 1 tablet (500 mg total) by mouth 2 (two) times daily. 01/04/17   Liberty Handy, PA-C  naproxen (NAPROSYN) 500 MG tablet Take 1 tablet (500 mg total) by mouth 2 (two) times daily. 01/04/17   Liberty Handy, PA-C  ondansetron (ZOFRAN) 4 MG tablet Take 1 tablet (4 mg total) by mouth every 8 (eight) hours as needed for nausea or vomiting. 02/10/17   Ajahni Nay Orlene Och, NP  oxyCODONE-acetaminophen (PERCOCET/ROXICET) 5-325 MG tablet Take 2 tablets by mouth every 4 (four) hours as needed for severe pain. 01/04/17   Liberty Handy, PA-C  risperiDONE (RISPERDAL) 1 MG tablet Take 1 mg by mouth at bedtime.     Historical Provider, MD  traZODone (DESYREL) 50 MG tablet  Take 50 mg by mouth at bedtime as needed for sleep.    Historical Provider, MD  trolamine salicylate (ASPERCREME) 10 % cream Apply 1 application topically as needed for muscle pain.    Historical Provider, MD    Family History No family history on file.  Social History Social History  Substance Use Topics  . Smoking status: Never Smoker  . Smokeless tobacco: Never Used  . Alcohol use No     Allergies   Patient has no known allergies.   Review of Systems Review of Systems  Constitutional: Negative for fever.  HENT: Negative  for congestion.   Respiratory: Negative for cough.   Gastrointestinal: Positive for diarrhea, nausea and vomiting. Negative for abdominal pain.  Genitourinary: Negative for dysuria and flank pain.  Skin: Negative for rash.  Psychiatric/Behavioral: Negative for confusion.     Physical Exam Updated Vital Signs BP 116/86 (BP Location: Left Arm)   Pulse 68   Temp 98.2 F (36.8 C) (Oral)   Resp 18   Ht 5\' 4"  (1.626 m)   Wt 125 lb (56.7 kg)   LMP 10/10/2011   SpO2 100%   BMI 21.46 kg/m   Physical Exam  Constitutional: She appears well-developed and well-nourished. No distress.  HENT:  Head: Normocephalic.  Mouth/Throat: Oropharynx is clear and moist.  Eyes: Conjunctivae are normal.  Neck: Normal range of motion. Neck supple.  Cardiovascular: Normal rate and regular rhythm.   Pulmonary/Chest: Effort normal. No respiratory distress. She has no wheezes. She has no rales.  Abdominal: Soft. Bowel sounds are normal. There is no tenderness.  Musculoskeletal: Normal range of motion.  Neurological: She is alert.  Skin: Skin is warm and dry.  Psychiatric: She has a normal mood and affect.  Nursing note and vitals reviewed.    ED Treatments / Results  DIAGNOSTIC STUDIES: Oxygen Saturation is 100% on RA, normal by my interpretation.    COORDINATION OF CARE: 11:26 PM- Pt advised of plan for treatment and pt agrees.  Labs (all labs ordered are listed, but only abnormal results are displayed) Labs Reviewed  I-STAT CHEM 8, ED - Abnormal; Notable for the following:       Result Value   Calcium, Ion 1.12 (*)    All other components within normal limits    Radiology No results found.  Procedures Procedures (including critical care time)  Medications Ordered in ED Medications  ondansetron (ZOFRAN-ODT) 4 MG disintegrating tablet (not administered)  0.9 %  sodium chloride infusion ( Intravenous Stopped 02/10/17 0209)  ondansetron (ZOFRAN-ODT) disintegrating tablet 4 mg (4 mg  Oral Given 02/09/17 2023)  metoCLOPramide (REGLAN) injection 10 mg (10 mg Intravenous Given 02/09/17 2345)  famotidine (PEPCID) IVPB 20 mg premix (0 mg Intravenous Stopped 02/10/17 0209)     Initial Impression / Assessment and Plan / ED Course  I have reviewed the triage vital signs and the nursing notes.  Pertinent lab results that were available during my care of the patient were reviewed by me and considered in my medical decision making (see chart for details).   Final Clinical Impressions(s) / ED Diagnoses  51 y.o. female with n/v/d stable for d/c without severe dehydration and does not appear toxic. Patient improved after IV hydration and medications. Will d/c home with medication for n/v and pepcid. Patient to f/u with her PCP or return here for worsening symptoms.  Final diagnoses:  Non-intractable vomiting with nausea, unspecified vomiting type    New Prescriptions New Prescriptions   FAMOTIDINE (PEPCID)  20 MG TABLET    Take 1 tablet (20 mg total) by mouth 2 (two) times daily.   ONDANSETRON (ZOFRAN) 4 MG TABLET    Take 1 tablet (4 mg total) by mouth every 8 (eight) hours as needed for nausea or vomiting.   *I personally performed the services described in this documentation, which was scribed in my presence. The recorded information has been reviewed and is accurate.     [address] [place], [place] 02/10/17 1610    Gwyneth Sprout, MD 02/10/17 [phone]

## 2017-02-09 NOTE — ED Triage Notes (Signed)
1 episode of vomiting last night. 2 episodes of diarrhea. No vomiting today but remains nauseated. Denies abdominal pain

## 2017-02-10 MED ORDER — ONDANSETRON HCL 4 MG PO TABS
4.0000 mg | ORAL_TABLET | Freq: Three times a day (TID) | ORAL | 0 refills | Status: DC | PRN
Start: 2017-02-10 — End: 2017-03-07

## 2017-02-10 MED ORDER — FAMOTIDINE 20 MG PO TABS: 20.0000 mg | ORAL_TABLET | Freq: Two times a day (BID) | ORAL | 0 refills | Status: DC

## 2017-02-10 NOTE — ED Notes (Signed)
Pt given

## 2017-02-10 NOTE — ED Notes (Addendum)
Pt verbalized understanding of d/c instructions and has no further questions. Pt is stable, A&Ox4, VSS. Pt able to hold down food and drinks

## 2017-02-20 ENCOUNTER — Ambulatory Visit: Payer: Self-pay | Admitting: Family Medicine

## 2017-02-27 ENCOUNTER — Emergency Department (HOSPITAL_COMMUNITY): Payer: Self-pay

## 2017-02-27 ENCOUNTER — Encounter (HOSPITAL_COMMUNITY): Payer: Self-pay | Admitting: *Deleted

## 2017-02-27 ENCOUNTER — Emergency Department (HOSPITAL_COMMUNITY)
Admission: EM | Admit: 2017-02-27 | Discharge: 2017-02-28 | Disposition: A | Discharge status: Home / Self Care | Payer: Self-pay | Attending: Emergency Medicine | Admitting: Emergency Medicine

## 2017-02-27 DIAGNOSIS — R05 Cough: Secondary | ICD-10-CM

## 2017-02-27 DIAGNOSIS — J45909 Unspecified asthma, uncomplicated: Secondary | ICD-10-CM | POA: Insufficient documentation

## 2017-02-27 DIAGNOSIS — J069 Acute upper respiratory infection, unspecified: Secondary | ICD-10-CM | POA: Insufficient documentation

## 2017-02-27 DIAGNOSIS — Z79899 Other long term (current) drug therapy: Secondary | ICD-10-CM | POA: Insufficient documentation

## 2017-02-27 DIAGNOSIS — Z7982 Long term (current) use of aspirin: Secondary | ICD-10-CM | POA: Insufficient documentation

## 2017-02-27 DIAGNOSIS — R059 Cough, unspecified: Secondary | ICD-10-CM

## 2017-02-27 NOTE — ED Triage Notes (Signed)
Pt reports not feeling well today, c/o h/a, productive cough, congestion and L hand swelling.  She reports the swelling in her L hand happened about a month ago and went away.

## 2017-02-27 NOTE — ED Provider Notes (Signed)
[facility]-EMERGENCY DEPT Provider Note   CSN: [id number] Arrival date & time: 02/27/17  2050     History   Chief Complaint Chief Complaint  Patient presents with  . Cough  . Headache  . Hand Problem    HPI Amanda Keith is a 51 y.o. female.  She reports that today around 2 PM she noticed that her throat felt "scratchy" after then she says that she started coughing, feeling like her nose was stuffy and running at the same time, generally not feeling well and generalized body aches.  She reports she has been feeling worse sine this started.  She says that her chest hurts when she coughs and that she is coughing up mucus.  Says as long as she doesn't cough her chest does not hurt.  No diarrhea, nausea, vomiting.  She also reports that her left hand is swollen.  She noticed it around 5pm.  Reports that this happened about a month ago but is unable to recall how long it took to resolve or if she had any treatment.  Patient is a poor hisorian.          Past Medical History:  Diagnosis Date  . Abuse, drug or alcohol   . Anxiety   . Asthma   . Schizo affective schizophrenia (HCC)   . Scoliosis     Patient Active Problem List   Diagnosis Date Noted  . Schizoaffective disorder, depressive type (HCC) 12/21/2016  . Cocaine abuse 12/21/2016    Past Surgical History:  Procedure Laterality Date  . ABDOMINAL HYSTERECTOMY    . CESAREAN SECTION      OB History    No data available       Home Medications    Prior to Admission medications   Medication Sig Start Date End Date Taking? Authorizing Provider  albuterol (PROVENTIL HFA;VENTOLIN HFA) 108 (90 Base) MCG/ACT inhaler Inhale 1-2 puffs into the lungs every 6 (six) hours as needed for wheezing or shortness of breath.    Historical Provider, MD  Aspirin-Caffeine 500-32.5 MG TABS Take 1 tablet by mouth daily as needed (scoliosis pain).    Historical Provider, MD  budesonide-formoterol (SYMBICORT) 80-4.5 MCG/ACT inhaler Inhale 2  puffs into the lungs 2 (two) times daily as needed (for asthma flares).    Historical Provider, MD  diclofenac sodium (VOLTAREN) 1 % GEL Apply 2 g topically 4 (four) times daily. 01/04/17   Liberty Handy, PA-C  famotidine (PEPCID) 20 MG tablet Take 1 tablet (20 mg total) by mouth 2 (two) times daily. 02/10/17   Hope Orlene Och, NP  gabapentin (NEURONTIN) 300 MG capsule Take 300 mg by mouth at bedtime.     Historical Provider, MD  methocarbamol (ROBAXIN) 500 MG tablet Take 1 tablet (500 mg total) by mouth 2 (two) times daily. 01/04/17   Liberty Handy, PA-C  naproxen (NAPROSYN) 500 MG tablet Take 1 tablet (500 mg total) by mouth 2 (two) times daily. 01/04/17   Liberty Handy, PA-C  ondansetron (ZOFRAN) 4 MG tablet Take 1 tablet (4 mg total) by mouth every 8 (eight) hours as needed for nausea or vomiting. 02/10/17   Hope Orlene Och, NP  oxyCODONE-acetaminophen (PERCOCET/ROXICET) 5-325 MG tablet Take 2 tablets by mouth every 4 (four) hours as needed for severe pain. 01/04/17   Liberty Handy, PA-C  risperiDONE (RISPERDAL) 1 MG tablet Take 1 mg by mouth at bedtime.     Historical Provider, MD  traZODone (DESYREL) 50 MG tablet Take 50 mg  by mouth at bedtime as needed for sleep.    Historical Provider, MD  trolamine salicylate (ASPERCREME) 10 % cream Apply 1 application topically as needed for muscle pain.    Historical Provider, MD    Family History No family history on file.  Social History Social History  Substance Use Topics  . Smoking status: Never Smoker  . Smokeless tobacco: Never Used  . Alcohol use No     Allergies   Patient has no known allergies.   Review of Systems Review of Systems  Constitutional: Positive for chills and fatigue. Negative for diaphoresis and fever.  HENT: Positive for congestion, postnasal drip, rhinorrhea and sore throat. Negative for drooling, ear pain, sinus pain, sinus pressure, sneezing, trouble swallowing and voice change.   Eyes: Negative for  discharge.  Respiratory: Positive for cough and shortness of breath ("When I cough too much"). Negative for choking, chest tightness and wheezing.   Cardiovascular: Positive for chest pain (When coughing).  Gastrointestinal: Positive for abdominal pain. Negative for constipation, diarrhea, nausea and vomiting.  Genitourinary: Negative for dysuria.  Musculoskeletal: Positive for arthralgias and joint swelling. Negative for back pain and gait problem.     Physical Exam Updated Vital Signs BP 123/88 (BP Location: Right Arm)   Pulse 88   Temp 98.9 F (37.2 C) (Oral)   Resp 14   Ht 5\' 4"  (1.626 m)   Wt 59 kg   LMP 10/10/2011   SpO2 99%   BMI 22.31 kg/m   Physical Exam  Constitutional: She appears well-developed and well-nourished. No distress.  HENT:  Head: Normocephalic and atraumatic.  Right Ear: Tympanic membrane and external ear normal.  Left Ear: Tympanic membrane and external ear normal.  Mouth/Throat: Uvula is midline, oropharynx is clear and moist and mucous membranes are normal. No oropharyngeal exudate, posterior oropharyngeal edema or posterior oropharyngeal erythema.  Eyes: Conjunctivae are normal.  Neck: Normal range of motion.  Cardiovascular: Normal rate.   Pulmonary/Chest: Effort normal and breath sounds normal. No accessory muscle usage or stridor. No respiratory distress. She has no wheezes. She exhibits tenderness.  Abdominal: Soft. Bowel sounds are normal. She exhibits no distension. There is no tenderness.  Musculoskeletal: She exhibits no deformity.  Neurological: She is alert.  Skin: Skin is warm and dry. She is not diaphoretic.  Psychiatric: She has a normal mood and affect. Her behavior is normal.  Nursing note and vitals reviewed.    ED Treatments / Results  Labs (all labs ordered are listed, but only abnormal results are displayed) Labs Reviewed - No data to display  EKG  EKG Interpretation None       Radiology Dg Chest 2 View  Result  Date: 02/27/2017 CLINICAL DATA:  Productive cough with mid upper chest pain EXAM: CHEST  2 VIEW COMPARISON:  None. FINDINGS: The heart size and mediastinal contours are within normal limits. Both lungs are clear. Mild scoliosis. IMPRESSION: No active cardiopulmonary disease. Electronically Signed   By: Jasmine Pang M.D.   On: 02/27/2017 23:05    Procedures Procedures (including critical care time)  Medications Ordered in ED Medications - No data to display   Initial Impression / Assessment and Plan / ED Course  I have reviewed the triage vital signs and the nursing notes.  Pertinent labs & imaging results that were available during my care of the patient were reviewed by me and considered in my medical decision making (see chart for details).  Clinical Course as of Feb 27 2333  Fri Feb 27, 2017  2317 DG Chest 2 View [EH]  2317 DG Chest 2 View [EH]    Clinical Course User Index [EH] Cristina GongElizabeth W Wille [place]on, [place]    MDM: Pt CXR negative for acute infiltrate. Patients symptoms are consistent with URI, likely viral etiology. Discussed that antibiotics are not indicated for viral infections. Pt will be discharged with symptomatic treatment.  Verbalizes understanding and is agreeable with plan. Pt is hemodynamically stable & in NAD prior to dc.  Given return precautions and voiced understanding.  Patient Hand swelling appeared to decrease during her stay in the ED.  Advised to follow up with PCP as outpatient.  Given no trauma, this is a recurring issue with no signs of localized infection and minimal pain x-ray was likely to be low yield and was not ordered.   Final Clinical Impressions(s) / ED Diagnoses   Final diagnoses:  Viral upper respiratory tract infection  Cough    New Prescriptions New Prescriptions   No medications on file     Cristina Gonglizabeth W Kylen Ismael, [place] 02/27/17 2334    Shaune Pollackameron Isaacs, MD 02/28/17 1315

## 2017-03-07 ENCOUNTER — Encounter (HOSPITAL_COMMUNITY): Payer: Self-pay | Admitting: *Deleted

## 2017-03-07 ENCOUNTER — Emergency Department (HOSPITAL_COMMUNITY): Payer: Self-pay

## 2017-03-07 ENCOUNTER — Emergency Department (HOSPITAL_COMMUNITY)
Admission: EM | Admit: 2017-03-07 | Discharge: 2017-03-07 | Disposition: A | Discharge status: Home / Self Care | Payer: Self-pay | Attending: Emergency Medicine | Admitting: Emergency Medicine

## 2017-03-07 DIAGNOSIS — R531 Weakness: Secondary | ICD-10-CM | POA: Insufficient documentation

## 2017-03-07 DIAGNOSIS — Z760 Encounter for issue of repeat prescription: Secondary | ICD-10-CM | POA: Insufficient documentation

## 2017-03-07 DIAGNOSIS — R0602 Shortness of breath: Secondary | ICD-10-CM | POA: Insufficient documentation

## 2017-03-07 DIAGNOSIS — J45909 Unspecified asthma, uncomplicated: Secondary | ICD-10-CM | POA: Insufficient documentation

## 2017-03-07 LAB — CBC
HCT: 36.2 % (ref 36.0–46.0)
Hemoglobin: 12.2 g/dL (ref 12.0–15.0)
MCH: 29.3 pg (ref 26.0–34.0)
MCHC: 33.7 g/dL (ref 30.0–36.0)
MCV: 86.8 fL (ref 78.0–100.0)
PLATELETS: 229 10*3/uL (ref 150–400)
RBC: 4.17 MIL/uL (ref 3.87–5.11)
RDW: 12.4 % (ref 11.5–15.5)
WBC: 4.8 10*3/uL (ref 4.0–10.5)

## 2017-03-07 LAB — TSH: TSH: 0.92 u[IU]/mL (ref 0.350–4.500)

## 2017-03-07 LAB — BASIC METABOLIC PANEL
Anion gap: 10 (ref 5–15)
BUN: 14 mg/dL (ref 6–20)
CALCIUM: 9.2 mg/dL (ref 8.9–10.3)
CHLORIDE: 102 mmol/L (ref 101–111)
CO2: 26 mmol/L (ref 22–32)
CREATININE: 0.88 mg/dL (ref 0.44–1.00)
GFR calc Af Amer: >60 mL/min (ref >60)
Glucose, Bld: 93 mg/dL (ref 65–99)
POTASSIUM: 3.5 mmol/L (ref 3.5–5.1)
Sodium: 138 mmol/L (ref 135–145)

## 2017-03-07 LAB — URINALYSIS, ROUTINE W REFLEX MICROSCOPIC
BILIRUBIN URINE: NEGATIVE
Glucose, UA: NEGATIVE mg/dL
Hgb urine dipstick: NEGATIVE
Ketones, ur: NEGATIVE mg/dL
Nitrite: NEGATIVE
PROTEIN: NEGATIVE mg/dL
Specific Gravity, Urine: 1.015 (ref 1.005–1.030)
pH: 6 (ref 5.0–8.0)

## 2017-03-07 LAB — URINALYSIS, MICROSCOPIC (REFLEX)

## 2017-03-07 LAB — BRAIN NATRIURETIC PEPTIDE: B NATRIURETIC PEPTIDE 5: 11.1 pg/mL (ref 0.0–100.0)

## 2017-03-07 MED ORDER — ALBUTEROL SULFATE HFA 108 (90 BASE) MCG/ACT IN AERS: 1.0000 | INHALATION_SPRAY | Freq: Four times a day (QID) | RESPIRATORY_TRACT | 0 refills | Status: DC | PRN

## 2017-03-07 MED ORDER — BUDESONIDE-FORMOTEROL FUMARATE 80-4.5 MCG/ACT IN AERO: 2.0000 | INHALATION_SPRAY | Freq: Two times a day (BID) | RESPIRATORY_TRACT | 0 refills | Status: DC | PRN

## 2017-03-07 MED ORDER — AEROCHAMBER PLUS W/MASK MISC
1.0000 | Freq: Once | Status: AC
  Administered 2017-03-07: 1
  Filled 2017-03-07: qty 1

## 2017-03-07 MED ORDER — ALBUTEROL SULFATE HFA 108 (90 BASE) MCG/ACT IN AERS
2.0000 | INHALATION_SPRAY | RESPIRATORY_TRACT | Status: DC | PRN
  Administered 2017-03-07: 2 via RESPIRATORY_TRACT
  Filled 2017-03-07: qty 6.7

## 2017-03-07 MED ORDER — SODIUM CHLORIDE 0.9 % IV BOLUS (SEPSIS)
500.0000 mL | Freq: Once | INTRAVENOUS | Status: AC
  Administered 2017-03-07: 500 mL via INTRAVENOUS

## 2017-03-07 NOTE — ED Provider Notes (Signed)
[facility]-EMERGENCY DEPT Provider Note   CSN: [id number] Arrival date & time: 03/07/17  0109     History   Chief Complaint Chief Complaint  Patient presents with  . Weakness    HPI Amanda Keith is a 51 y.o. female with a hx of drug abuse, anxiety, asthma, schizophrenia presents to the Emergency Department complaining of gradual, persistent, progressively worsening fatigue onset 2 weeks ago.  Patient reports at that time she had URI symptoms which have since improved. She does report some persistent shortness of breath several days worse with exertion. She reports that she believes she is dehydrated secondary to her URI. Nothing makes her symptoms better. She reports associated swelling in her legs. She denies chest pain, abdominal pain, nausea, vomiting, diarrhea, syncope, dysuria, hematuria. Patient denies IV drug use.   The history is provided by the patient and medical records. No language interpreter was used.    Past Medical History:  Diagnosis Date  . Abuse, drug or alcohol   . Anxiety   . Asthma   . Schizo affective schizophrenia (HCC)   . Scoliosis     Patient Active Problem List   Diagnosis Date Noted  . Schizoaffective disorder, depressive type (HCC) 12/21/2016  . Cocaine abuse 12/21/2016    Past Surgical History:  Procedure Laterality Date  . ABDOMINAL HYSTERECTOMY    . CESAREAN SECTION      OB History    No data available       Home Medications    Prior to Admission medications   Medication Sig Start Date End Date Taking? Authorizing Provider  Atorvastatin Calcium (LIPITOR PO) Take 1 tablet by mouth every evening.   Yes Historical Provider, MD  gabapentin (NEURONTIN) 300 MG capsule Take 300 mg by mouth at bedtime.    Yes Historical Provider, MD  risperiDONE (RISPERDAL) 1 MG tablet Take 1 mg by mouth at bedtime.    Yes Historical Provider, MD  traZODone (DESYREL) 50 MG tablet Take 50 mg by mouth at bedtime as needed for sleep.   Yes Historical Provider,  MD  albuterol (PROVENTIL HFA;VENTOLIN HFA) 108 (90 Base) MCG/ACT inhaler Inhale 1-2 puffs into the lungs every 6 (six) hours as needed for wheezing or shortness of breath. 03/07/17   Dahlia Client Harlem Bula, PA-C  budesonide-formoterol (SYMBICORT) 80-4.5 MCG/ACT inhaler Inhale 2 puffs into the lungs 2 (two) times daily as needed (for asthma flares). 03/07/17   Dahlia Client Mayleen Borrero, PA-C    Family History No family history on file.  Social History Social History  Substance Use Topics  . Smoking status: Never Smoker  . Smokeless tobacco: Never Used  . Alcohol use No     Allergies   Patient has no known allergies.   Review of Systems Review of Systems  Constitutional: Positive for fatigue.  Respiratory: Positive for shortness of breath.   Neurological: Positive for weakness.  All other systems reviewed and are negative.    Physical Exam Updated Vital Signs BP (!) 127/95   Pulse 63   Temp 97.6 F (36.4 C) (Oral)   Resp 14   Ht 5\' 4"  (1.626 m)   Wt 56 kg   LMP 10/10/2011   SpO2 100%   BMI 21.21 kg/m   Physical Exam  Constitutional: She appears well-developed and well-nourished. No distress.  Awake, alert, nontoxic appearance  HENT:  Head: Normocephalic and atraumatic.  Mouth/Throat: Oropharynx is clear and moist. No oropharyngeal exudate.  Eyes: Conjunctivae are normal. No scleral icterus.  Neck: Normal range of motion. Neck  supple.  Cardiovascular: Normal rate, regular rhythm and intact distal pulses.   No murmur heard. Pulses:      Radial pulses are 2+ on the right side, and 2+ on the left side.       Dorsalis pedis pulses are 2+ on the right side, and 2+ on the left side.  Pulmonary/Chest: Effort normal and breath sounds normal. No respiratory distress. She has no wheezes. She has no rhonchi. She has no rales.  Equal chest expansion  Abdominal: Soft. Bowel sounds are normal. She exhibits no mass. There is no tenderness. There is no rebound, no guarding and no CVA  tenderness.  Musculoskeletal: Normal range of motion. She exhibits no edema.  No peripheral edema No swollen joints  Neurological: She is alert.  Speech is clear and goal oriented Moves extremities without ataxia  Skin: Skin is warm and dry. She is not diaphoretic.  No splinter hemorrhages No petechiae or purpura  Psychiatric: She has a normal mood and affect.  Nursing note and vitals reviewed.    ED Treatments / Results  Labs (all labs ordered are listed, but only abnormal results are displayed) Labs Reviewed  URINALYSIS, ROUTINE W REFLEX MICROSCOPIC - Abnormal; Notable for the following:       Result Value   Leukocytes, UA TRACE (*)    All other components within normal limits  URINALYSIS, MICROSCOPIC (REFLEX) - Abnormal; Notable for the following:    Bacteria, UA RARE (*)    Squamous Epithelial / LPF 0-5 (*)    All other components within normal limits  CBC  BASIC METABOLIC PANEL  TSH  BRAIN NATRIURETIC PEPTIDE  T4    EKG  EKG Interpretation  Date/Time:  Saturday March 07 2017 06:22:02 EDT Ventricular Rate:  66 PR Interval:    QRS Duration: 89 QT Interval:  407 QTC Calculation: 427 R Axis:   69 Text Interpretation:  Sinus rhythm Consider left ventricular hypertrophy ST elev, probable normal early repol pattern No old tracing to compare Confirmed by CAMPOS  MD, Caryn Bee ([id number]) on 03/07/2017 6:58:44 AM       Radiology Dg Chest 2 View  Result Date: 03/07/2017 CLINICAL DATA:  Initial evaluation for acute weakness, cough, congestion. EXAM: CHEST  2 VIEW COMPARISON:  Prior radiograph from 02/27/2017. FINDINGS: Cardiac and mediastinal silhouettes are stable in size and contour, and remain within normal limits. Lungs normally inflated. Mild scattered peribronchial thickening. No focal infiltrates. No pulmonary edema or pleural effusion. No pneumothorax. No acute osseous abnormality.  Scoliosis noted. IMPRESSION: Mild scattered peribronchial thickening, which may reflect  sequelae of acute bronchiolitis given the history of cough and congestion. No focal infiltrates to suggest pneumonia. Electronically Signed   By: Rise Mu M.D.   On: 03/07/2017 06:18    Procedures Procedures (including critical care time)  Medications Ordered in ED Medications  albuterol (PROVENTIL HFA;VENTOLIN HFA) 108 (90 Base) MCG/ACT inhaler 2 puff (2 puffs Inhalation Given 03/07/17 0739)  sodium chloride 0.9 % bolus 500 mL (0 mLs Intravenous Stopped 03/07/17 0719)  aerochamber plus with mask device 1 each (1 each Other Given 03/07/17 0740)     Initial Impression / Assessment and Plan / ED Course  I have reviewed the triage vital signs and the nursing notes.  Pertinent labs & imaging results that were available during my care of the patient were reviewed by me and considered in my medical decision making (see chart for details).     Patient presents with complaints of generalized weakness. Some  shortness of breath. No chest pain. She is well-appearing. Her vital signs are within normal limits. Lab work is reassuring. She denies history of IV drug use and has no clinical symptoms of endocarditis including no heart murmur, petechiae or spider hemorrhages. No track marks noted along her arms.  No signs or symptoms of infection. No areas of cellulitis. No leukocytosis. Patient with recent URI. Doubt myocarditis. Patient reports some swelling in her legs however none is noted on exam. No elevation in BNP. Normal EKG.  TSH normal. Patient given fluids at her request. She reports feeling better. Just reports she is out of her albuterol inhaler and Symbicort. Medications refilled. Will discharge home with close primary care follow-up.  Final Clinical Impressions(s) / ED Diagnoses   Final diagnoses:  Weakness  SOB (shortness of breath)  Medication refill    New Prescriptions Current Discharge Medication List       Dierdre ForthHannah Lemoyne Scarpati, PA-C 03/07/17 [id number]    Azalia BilisKevin Campos,  MD 03/07/17 [phone]

## 2017-03-07 NOTE — ED Notes (Signed)
Patient transported to X-ray 

## 2017-03-07 NOTE — ED Triage Notes (Signed)
The pt is c/o feeling weak and dehydrated for 2 weeks  She was seen here then and she did not get iv fluid and she thinks she should have had some some mild body cramps  lmp none

## 2017-03-07 NOTE — Discharge Instructions (Signed)
1. Medications: albuterol, symbicort, usual home medications 2. Treatment: rest, drink plenty of fluids,  3. Follow Up: Please followup with your primary doctor in 2 days for discussion of your diagnoses and further evaluation after today's visit; if you do not have a primary care doctor use the resource guide provided to find one; Please return to the ER for worsening symptoms, fevers, persistent shortness of breath or other concerns

## 2017-03-09 LAB — T4: T4, Total: 7.5 ug/dL (ref 4.5–12.0)

## 2017-03-17 ENCOUNTER — Emergency Department (HOSPITAL_COMMUNITY): Payer: Self-pay

## 2017-03-17 ENCOUNTER — Emergency Department (HOSPITAL_COMMUNITY)
Admission: EM | Admit: 2017-03-17 | Discharge: 2017-03-18 | Disposition: A | Discharge status: Home / Self Care | Payer: Self-pay | Attending: Emergency Medicine | Admitting: Emergency Medicine

## 2017-03-17 ENCOUNTER — Encounter (HOSPITAL_COMMUNITY): Payer: Self-pay | Admitting: Emergency Medicine

## 2017-03-17 DIAGNOSIS — J45909 Unspecified asthma, uncomplicated: Secondary | ICD-10-CM | POA: Insufficient documentation

## 2017-03-17 DIAGNOSIS — G8929 Other chronic pain: Secondary | ICD-10-CM | POA: Insufficient documentation

## 2017-03-17 DIAGNOSIS — M25572 Pain in left ankle and joints of left foot: Secondary | ICD-10-CM | POA: Insufficient documentation

## 2017-03-17 MED ORDER — NAPROXEN 375 MG PO TABS: 375.0000 mg | ORAL_TABLET | Freq: Two times a day (BID) | ORAL | 0 refills | Status: DC

## 2017-03-17 NOTE — Progress Notes (Signed)
Orthopedic Tech Progress Note Patient Details:  Amanda Keith 1966/12/11 [id number]  Ortho Devices Type of Ortho Device: ASO Ortho Device/Splint Location: lle Ortho Device/Splint Interventions: Ordered, Application   Trinna Post 03/17/2017, 11:36 PM

## 2017-03-17 NOTE — ED Triage Notes (Signed)
Patient here with left ankle pain.  She states that it has been going on for awhile, no injury to the ankle.  She states that the pain is getting worse.  She is able to walk on foot.

## 2017-03-17 NOTE — ED Provider Notes (Signed)
[facility] DEPT Provider Note   CSN: [id number] Arrival date & time: 03/17/17  2212  By signing my name below, I, Cynda Acres, attest that this documentation has been prepared under the direction and in the presence of Roxy Horseman, PA-C. Electronically Signed: Cynda Acres, Scribe. 03/17/17. 11:17 PM.  History   Chief Complaint Chief Complaint  Patient presents with  . Ankle Pain    HPI Comments: Amanda Keith is a 51 y.o. female with a history of polysubstance abuse, who presents to the Emergency Department complaining of sudden-onset, constant left ankle pain that began several weeks ago. Patient states she is unsure how she injured her ankle. Patient reports associated foot pain. No modifying factors indicated. Patient reports pain with ambulation. Patient describes her pain as shooting. Patient is ambulatory in the emergency department. Patient denies any numbness, weakness, nausea, vomiting, or fever.   The history is provided by the patient. No language interpreter was used.    Past Medical History:  Diagnosis Date  . Abuse, drug or alcohol   . Anxiety   . Asthma   . Schizo affective schizophrenia (HCC)   . Scoliosis     Patient Active Problem List   Diagnosis Date Noted  . Schizoaffective disorder, depressive type (HCC) 12/21/2016  . Cocaine abuse 12/21/2016    Past Surgical History:  Procedure Laterality Date  . ABDOMINAL HYSTERECTOMY    . CESAREAN SECTION      OB History    No data available       Home Medications    Prior to Admission medications   Medication Sig Start Date End Date Taking? Authorizing Provider  albuterol (PROVENTIL HFA;VENTOLIN HFA) 108 (90 Base) MCG/ACT inhaler Inhale 1-2 puffs into the lungs every 6 (six) hours as needed for wheezing or shortness of breath. 03/07/17   Dahlia Client Muthersbaugh, PA-C  Atorvastatin Calcium (LIPITOR PO) Take 1 tablet by mouth every evening.    Historical Provider, MD  budesonide-formoterol  (SYMBICORT) 80-4.5 MCG/ACT inhaler Inhale 2 puffs into the lungs 2 (two) times daily as needed (for asthma flares). 03/07/17   Hannah Muthersbaugh, PA-C  gabapentin (NEURONTIN) 300 MG capsule Take 300 mg by mouth at bedtime.     Historical Provider, MD  risperiDONE (RISPERDAL) 1 MG tablet Take 1 mg by mouth at bedtime.     Historical Provider, MD  traZODone (DESYREL) 50 MG tablet Take 50 mg by mouth at bedtime as needed for sleep.    Historical Provider, MD    Family History History reviewed. No pertinent family history.  Social History Social History  Substance Use Topics  . Smoking status: Never Smoker  . Smokeless tobacco: Never Used  . Alcohol use No     Allergies   Patient has no known allergies.   Review of Systems Review of Systems  Constitutional: Negative for fever.  Gastrointestinal: Negative for nausea and vomiting.  Musculoskeletal: Positive for arthralgias (left foot). Negative for joint swelling.  Neurological: Negative for weakness and numbness.     Physical Exam Updated Vital Signs BP 105/72 (BP Location: Right Arm)   Pulse 63   Temp 97.8 F (36.6 C)   Resp 16   LMP 10/10/2011   SpO2 100%   Physical Exam  Physical Exam  Constitutional: Pt appears well-developed and well-nourished. No distress.  HENT:  Head: Normocephalic and atraumatic.  Eyes: Conjunctivae are normal.  Neck: Normal range of motion.  Cardiovascular: Normal rate, regular rhythm and intact distal pulses.   Capillary refill < 3 sec  Pulmonary/Chest: Effort normal and breath sounds normal.  Musculoskeletal: Pt exhibits tenderness over the left lateral ankle with some tenderness to palpation over the achilles tendon insertion. Pt exhibits no edema.  ROM: 4/5  Neurological: Pt  is alert. Coordination normal.  Sensation 5/5 Strength 4/5  Skin: Skin is warm and dry. Pt is not diaphoretic.  No tenting of the skin  Psychiatric: Pt has a normal mood and affect.  Nursing note and vitals  reviewed.   ED Treatments / Results  DIAGNOSTIC STUDIES: Oxygen Saturation is 100% on RA, normal by my interpretation.    COORDINATION OF CARE: 11:17 PM Discussed treatment plan with pt at bedside and pt agreed to plan, which includes a brace and anti-inflammatory pain medication.   Labs (all labs ordered are listed, but only abnormal results are displayed) Labs Reviewed - No data to display  EKG  EKG Interpretation None       Radiology No results found.  Procedures Procedures (including critical care time)  Medications Ordered in ED Medications - No data to display   Initial Impression / Assessment and Plan / ED Course  I have reviewed the triage vital signs and the nursing notes.  Pertinent labs & imaging results that were available during my care of the patient were reviewed by me and considered in my medical decision making (see chart for details).    Patient X-Ray negative for obvious fracture or dislocation. Pain managed in ED. Pt advised to follow up with orthopedics if symptoms persist for possibility of missed fracture diagnosis. Patient given brace while in ED, conservative therapy recommended and discussed. Patient will be dc home & is agreeable with above plan.  Final Clinical Impressions(s) / ED Diagnoses   Final diagnoses:  Chronic pain of left ankle    New Prescriptions Discharge Medication List as of 03/17/2017 11:30 PM    START taking these medications   Details  naproxen (NAPROSYN) 375 MG tablet Take 1 tablet (375 mg total) by mouth 2 (two) times daily., Starting Tue 03/17/2017, Print       I personally performed the services described in this documentation, which was scribed in my presence. The recorded information has been reviewed and is accurate.       Roxy Horseman, PA-C 03/18/17 0023    Shaune Pollack, MD 03/18/17 [phone]

## 2017-03-18 ENCOUNTER — Emergency Department (HOSPITAL_COMMUNITY)
Admission: EM | Admit: 2017-03-18 | Discharge: 2017-03-19 | Disposition: A | Discharge status: Home / Self Care | Payer: Self-pay | Attending: Emergency Medicine | Admitting: Emergency Medicine

## 2017-03-18 ENCOUNTER — Encounter (HOSPITAL_COMMUNITY): Payer: Self-pay

## 2017-03-18 DIAGNOSIS — M7661 Achilles tendinitis, right leg: Secondary | ICD-10-CM | POA: Insufficient documentation

## 2017-03-18 DIAGNOSIS — J45909 Unspecified asthma, uncomplicated: Secondary | ICD-10-CM | POA: Insufficient documentation

## 2017-03-18 DIAGNOSIS — M7662 Achilles tendinitis, left leg: Secondary | ICD-10-CM

## 2017-03-18 DIAGNOSIS — M7989 Other specified soft tissue disorders: Secondary | ICD-10-CM

## 2017-03-18 NOTE — ED Triage Notes (Addendum)
PT C/O BILATERAL CALF PAIN AND FOOT SWELLING. PT STS THE PAIN AND SWELLING HAVE BEEN GOING ON FOR A WHILE, BUT IT HAS BEEN WORSE OVER THE PAST COUPLE OF DAYS. PT STS SHE WAS SEEN YESTERDAY FOR THE LEFT ANKLE AND GIVEN A BRACE, BUT NOTICED MORE PAIN AND SWELLING TO BOTH FEET TODAY. DENIES INJURY, PREVIOUS BLOOD CLOTS, OR BLOOD THINNER USE. PT AMBULATORY IN TRIAGE.

## 2017-03-18 NOTE — ED Notes (Signed)
Pt A&OX4, ambulatory with independent steady gait, NAD and states she has all of her belongings with her at d/c

## 2017-03-19 MED ORDER — FUROSEMIDE 20 MG PO TABS: ORAL_TABLET | ORAL | 0 refills | Status: DC

## 2017-03-19 NOTE — ED Provider Notes (Signed)
[facility]-EMERGENCY DEPT Provider Note: [name], MD, FACEP  CSN: [id number] MRN: [id number] ARRIVAL: 03/18/17 at 2249 ROOM: WA13/WA13 By signing my name below, I, Levon Hedger, attest that this documentation has been prepared under the direction and in the presence of Paula Libra, MD . Electronically Signed: Levon Hedger, Scribe. 03/19/2017. 3:46 AM.   CHIEF COMPLAINT  Foot Swelling  HISTORY OF PRESENT ILLNESS  Amanda Keith is a 51 y.o. female who presents to the Emergency Department complaining of recurrent (for ears) bilateral foot swelling that worsened a couple of days ago. Pt also reports associated moderate pain to her BLE, both in the Achilles tendons and the plantar surfaces. Pt was seen at [facility] on 03/17/17 for left ankle pain where she had a negative x-ray and was d/c home with naproxen and an ankle brace. Pt reports that she has taken naproxen with no relief of pain. She then noticed yesterday that the swelling to her right ankle had increased. She is not currently followed by a PCP due to lack of insurance. Pt has no other complaints or symptoms at this time.   Past Medical History:  Diagnosis Date  . Abuse, drug or alcohol   . Anxiety   . Asthma   . Schizo affective schizophrenia (HCC)   . Scoliosis     Past Surgical History:  Procedure Laterality Date  . ABDOMINAL HYSTERECTOMY    . CESAREAN SECTION      History reviewed. No pertinent family history.  Social History  Substance Use Topics  . Smoking status: Never Smoker  . Smokeless tobacco: Never Used  . Alcohol use No    Prior to Admission medications   Medication Sig Start Date End Date Taking? Authorizing Provider  albuterol (PROVENTIL HFA;VENTOLIN HFA) 108 (90 Base) MCG/ACT inhaler Inhale 1-2 puffs into the lungs every 6 (six) hours as needed for wheezing or shortness of breath. 03/07/17  Yes Hannah Muthersbaugh, PA-C  Atorvastatin Calcium (LIPITOR PO) Take 1 tablet by mouth every evening.   Yes  Historical Provider, MD  budesonide-formoterol (SYMBICORT) 80-4.5 MCG/ACT inhaler Inhale 2 puffs into the lungs 2 (two) times daily as needed (for asthma flares). 03/07/17  Yes Hannah Muthersbaugh, PA-C  gabapentin (NEURONTIN) 300 MG capsule Take 300 mg by mouth at bedtime.    Yes Historical Provider, MD  naproxen (NAPROSYN) 375 MG tablet Take 1 tablet (375 mg total) by mouth 2 (two) times daily. 03/17/17  Yes Roxy Horseman, PA-C  risperiDONE (RISPERDAL) 1 MG tablet Take 1 mg by mouth at bedtime.    Yes Historical Provider, MD  traZODone (DESYREL) 50 MG tablet Take 50 mg by mouth at bedtime as needed for sleep.   Yes Historical Provider, MD  furosemide (LASIX) 20 MG tablet Take one tablet daily as needed for leg swelling. 03/19/17   Paula Libra, MD    Allergies Patient has no known allergies.   REVIEW OF SYSTEMS  Negative except as noted here or in the History of Present Illness.  PHYSICAL EXAMINATION  Initial Vital Signs Blood pressure 95/63, pulse 68, temperature 97.9 F (36.6 C), temperature source Oral, resp. rate 18, height  (1.626 m), weight 122 lb (55.3 kg), last menstrual period 10/10/2011, SpO2 99 %.  Examination General: Well-developed, thin female in no acute distress; appearance consistent with age of record HENT: normocephalic; atraumatic Eyes: pupils equal, round and reactive to light; extraocular muscles intact Neck: supple Heart: regular rate and rhythm Lungs: clear to auscultation bilaterally Abdomen: soft; nondistended; nontender; no  masses or hepatosplenomegaly; bowel sounds present Extremities: No deformity; full range of motion; pulses normal; bilateral Achille's tendon tenderness; left ankle in orthotic device; trace edema of dorsal right foot Neurologic: Awake, alert and oriented; motor function intact in all extremities and symmetric; no facial droop Skin: Warm and dry; tender, fissured plantar calluses bilaterally. Psychiatric: Normal mood and  affect   RESULTS  Summary of this visit's results, reviewed by myself:   EKG Interpretation  Date/Time:    Ventricular Rate:    PR Interval:    QRS Duration:   QT Interval:    QTC Calculation:   R Axis:     Text Interpretation:        Laboratory Studies: No results found for this or any previous visit (from the past 24 hour(s)). Imaging Studies: Dg Ankle Complete Left  Result Date: 03/17/2017 CLINICAL DATA:  Nontraumatic left ankle pain for some time, worse tonight. EXAM: LEFT ANKLE COMPLETE - 3+ VIEW COMPARISON:  None. FINDINGS: There is no evidence of fracture, dislocation, or joint effusion. There is no evidence of arthropathy or other focal bone abnormality. Soft tissues are unremarkable. IMPRESSION: Negative. Electronically Signed   By: Ellery Plunk M.D.   On: 03/17/2017 22:56    ED COURSE  Nursing notes and initial vitals signs, including pulse oximetry, reviewed.  Vitals:   03/18/17 2255 03/18/17 2302 03/19/17 0224  BP: 108/72  95/63  Pulse: 88  68  Resp: 18  18  Temp: 97.9 F (36.6 C)    TempSrc: Oral    SpO2: 100%  99%  Weight:  122 lb (55.3 kg)   Height:   (1.626 m)    Patient advised that the naproxen she was prescribed may exacerbate her lower extremity edema. Lab work from 12 days ago shows no evidence of congestive heart failure, renal insufficiency or thyroid disease. DVT is unlikely as her symptoms have been ongoing for years and there is no appreciable calf edema on exam.  PROCEDURES    ED DIAGNOSES     ICD-9-CM ICD-10-CM   1. Achilles tendinitis of both lower extremities 726.71 M76.61     M76.62   2. Foot swelling 729.81 M79.89      I personally performed the services described in this documentation, which was scribed in my presence. The recorded information has been reviewed and is accurate.    Paula Libra, MD 03/19/17 [phone]

## 2017-05-28 IMAGING — DX DG LUMBAR SPINE COMPLETE 4+V
5 series · 5 of 5 positions shown · non-contrast
Comparison: None.

CLINICAL DATA: Worsening low back pain

EXAM:
LUMBAR SPINE - COMPLETE 4+ VIEW

[l-spine ap]
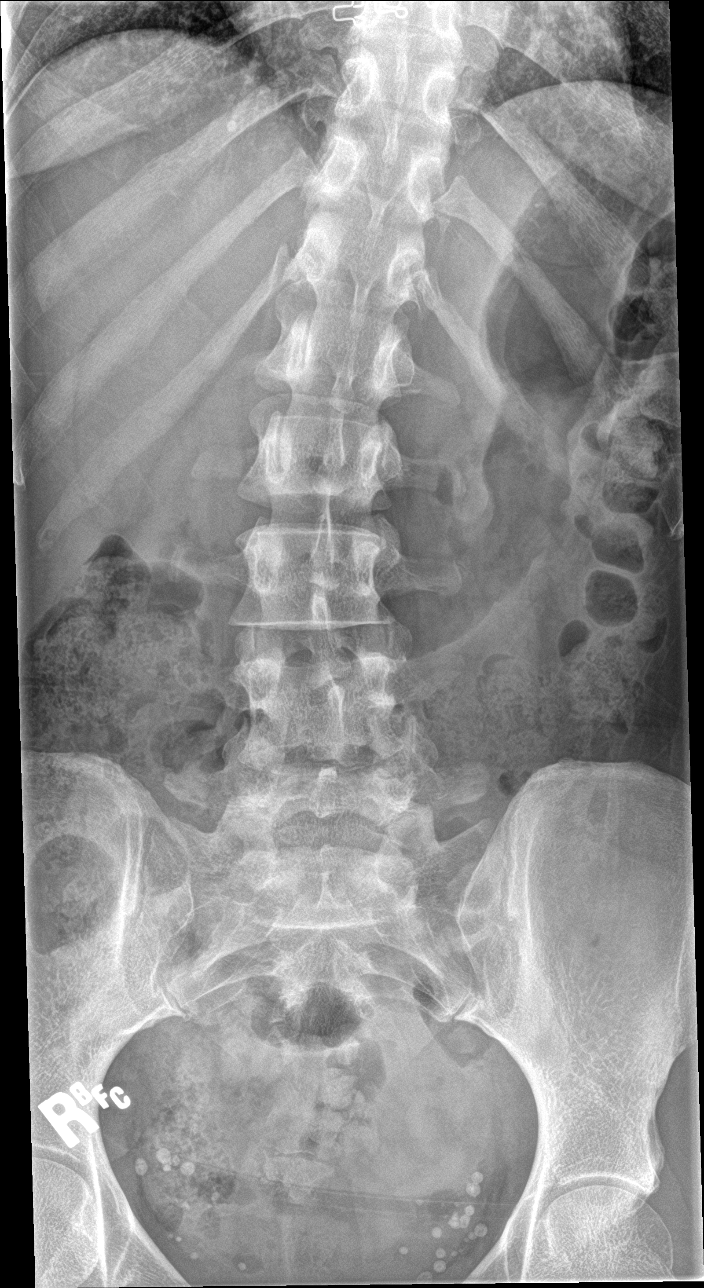

[l-spine obl (1 of 2)]
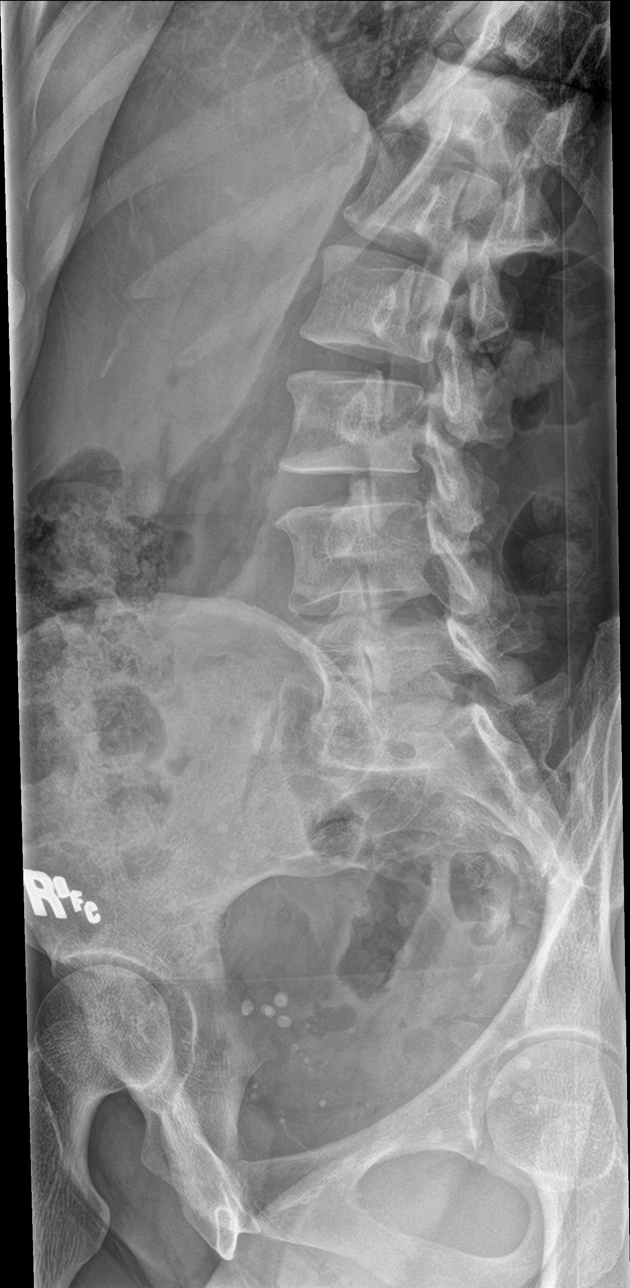

[l-spine obl (2 of 2)]
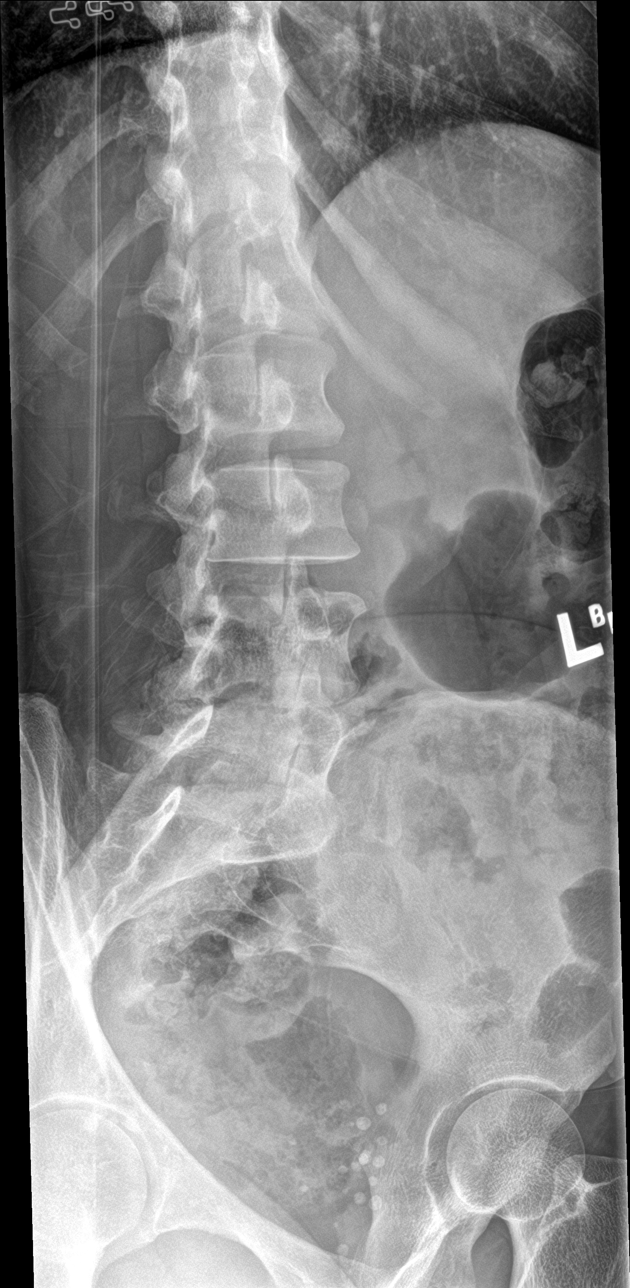

[l-spine lat]
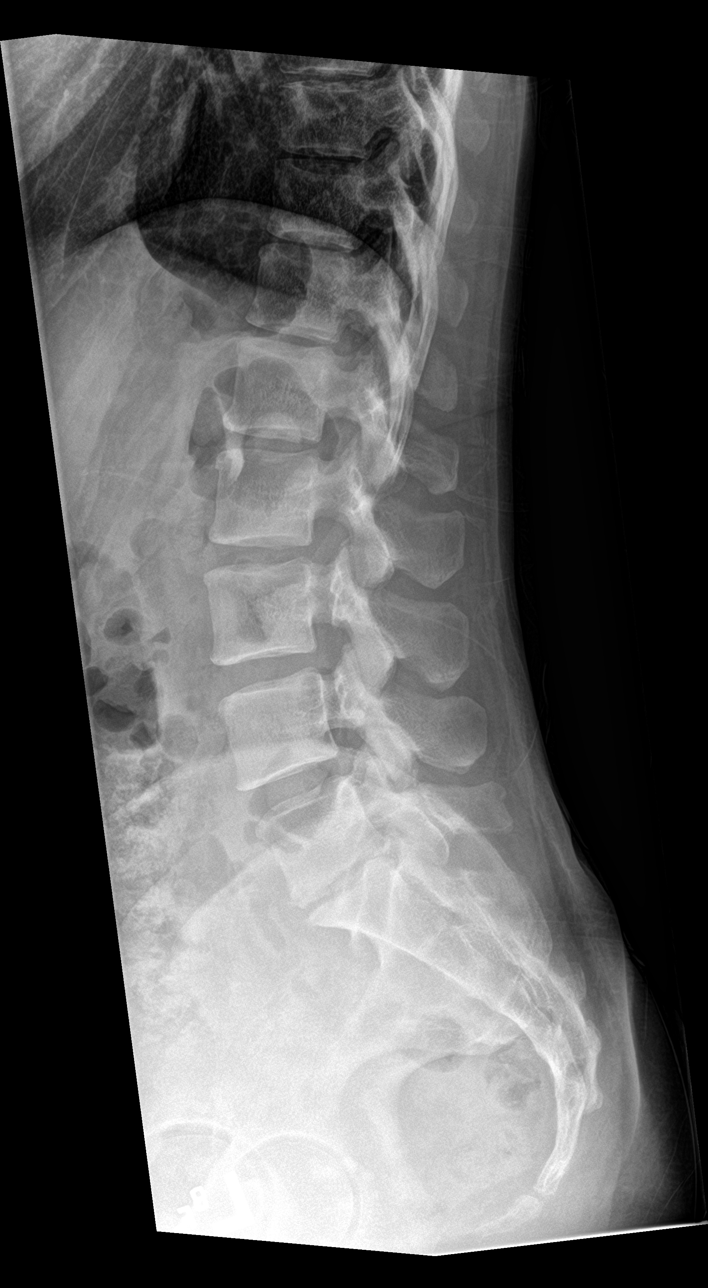

[l-spine spot]
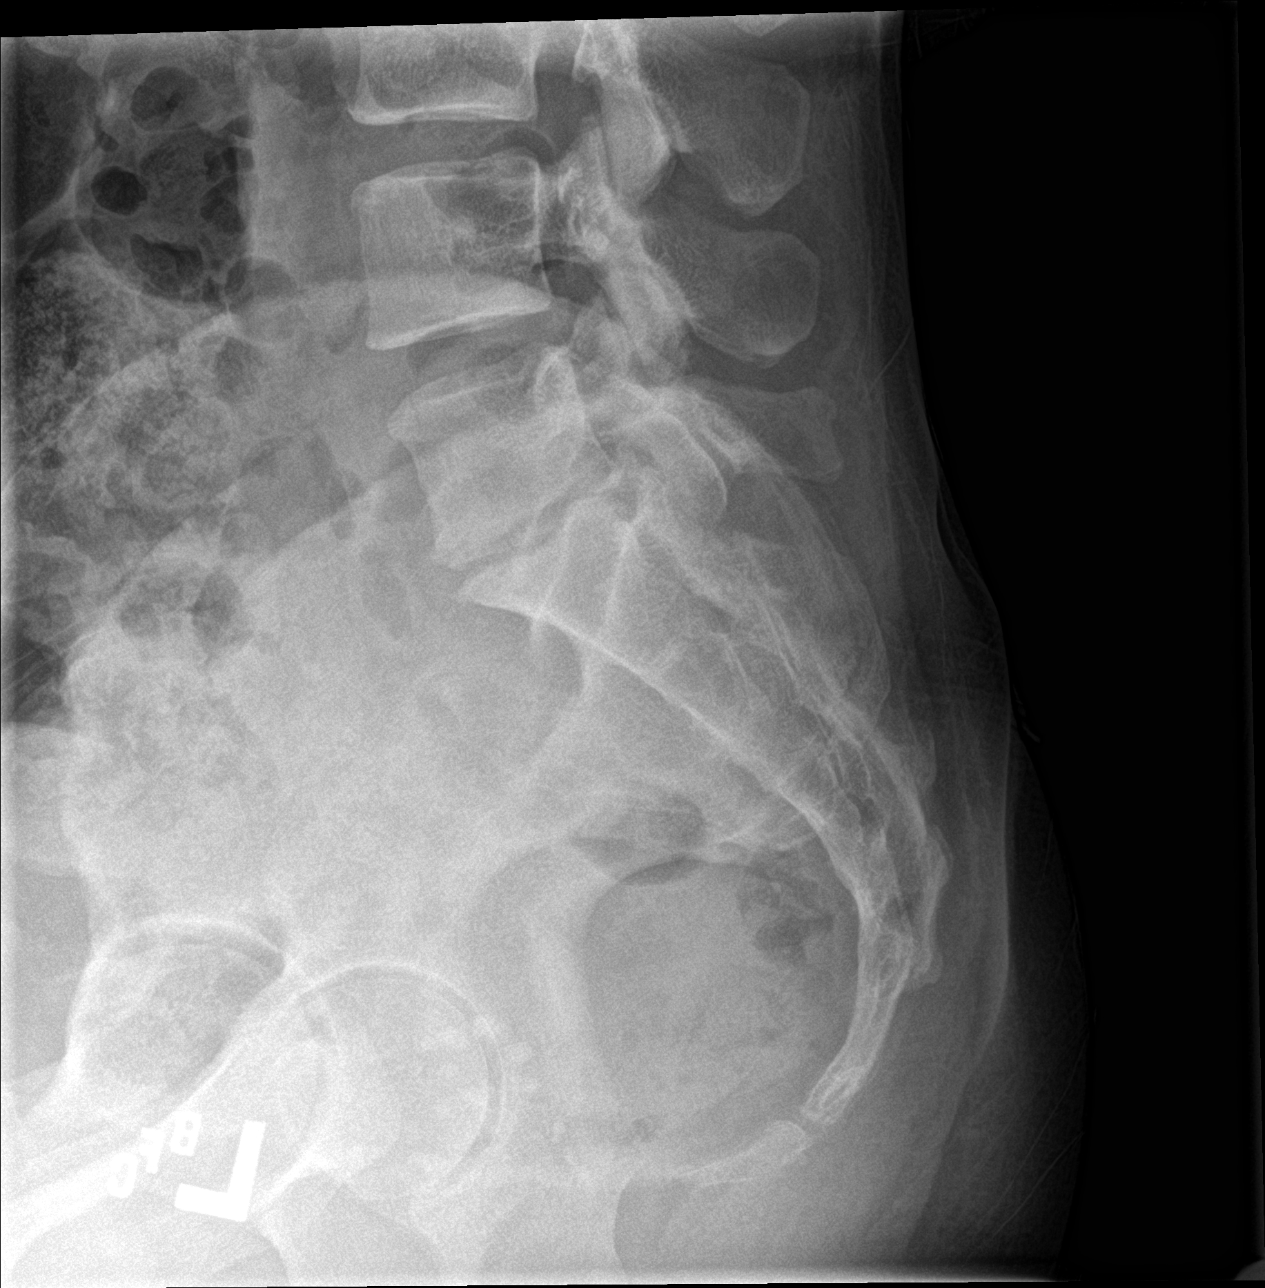

[5 of 5 positions shown; findings below may reference images not displayed]

FINDINGS: Mild dextroscoliosis of the lumbar spine. 5 non rib-bearing lumbar
type vertebra. Multiple calcified pelvic phleboliths. Moderate
narrowing at L5-S1. Lumbar alignment within normal limits. Vertebral
body heights are normal.
IMPRESSION: No acute osseous abnormality

## 2017-07-22 IMAGING — CR DG CHEST 2V
2 series · 2 of 2 positions shown · non-contrast
Comparison: None.

CLINICAL DATA: Productive cough with mid upper chest pain

EXAM:
CHEST  2 VIEW

[w chest pa]
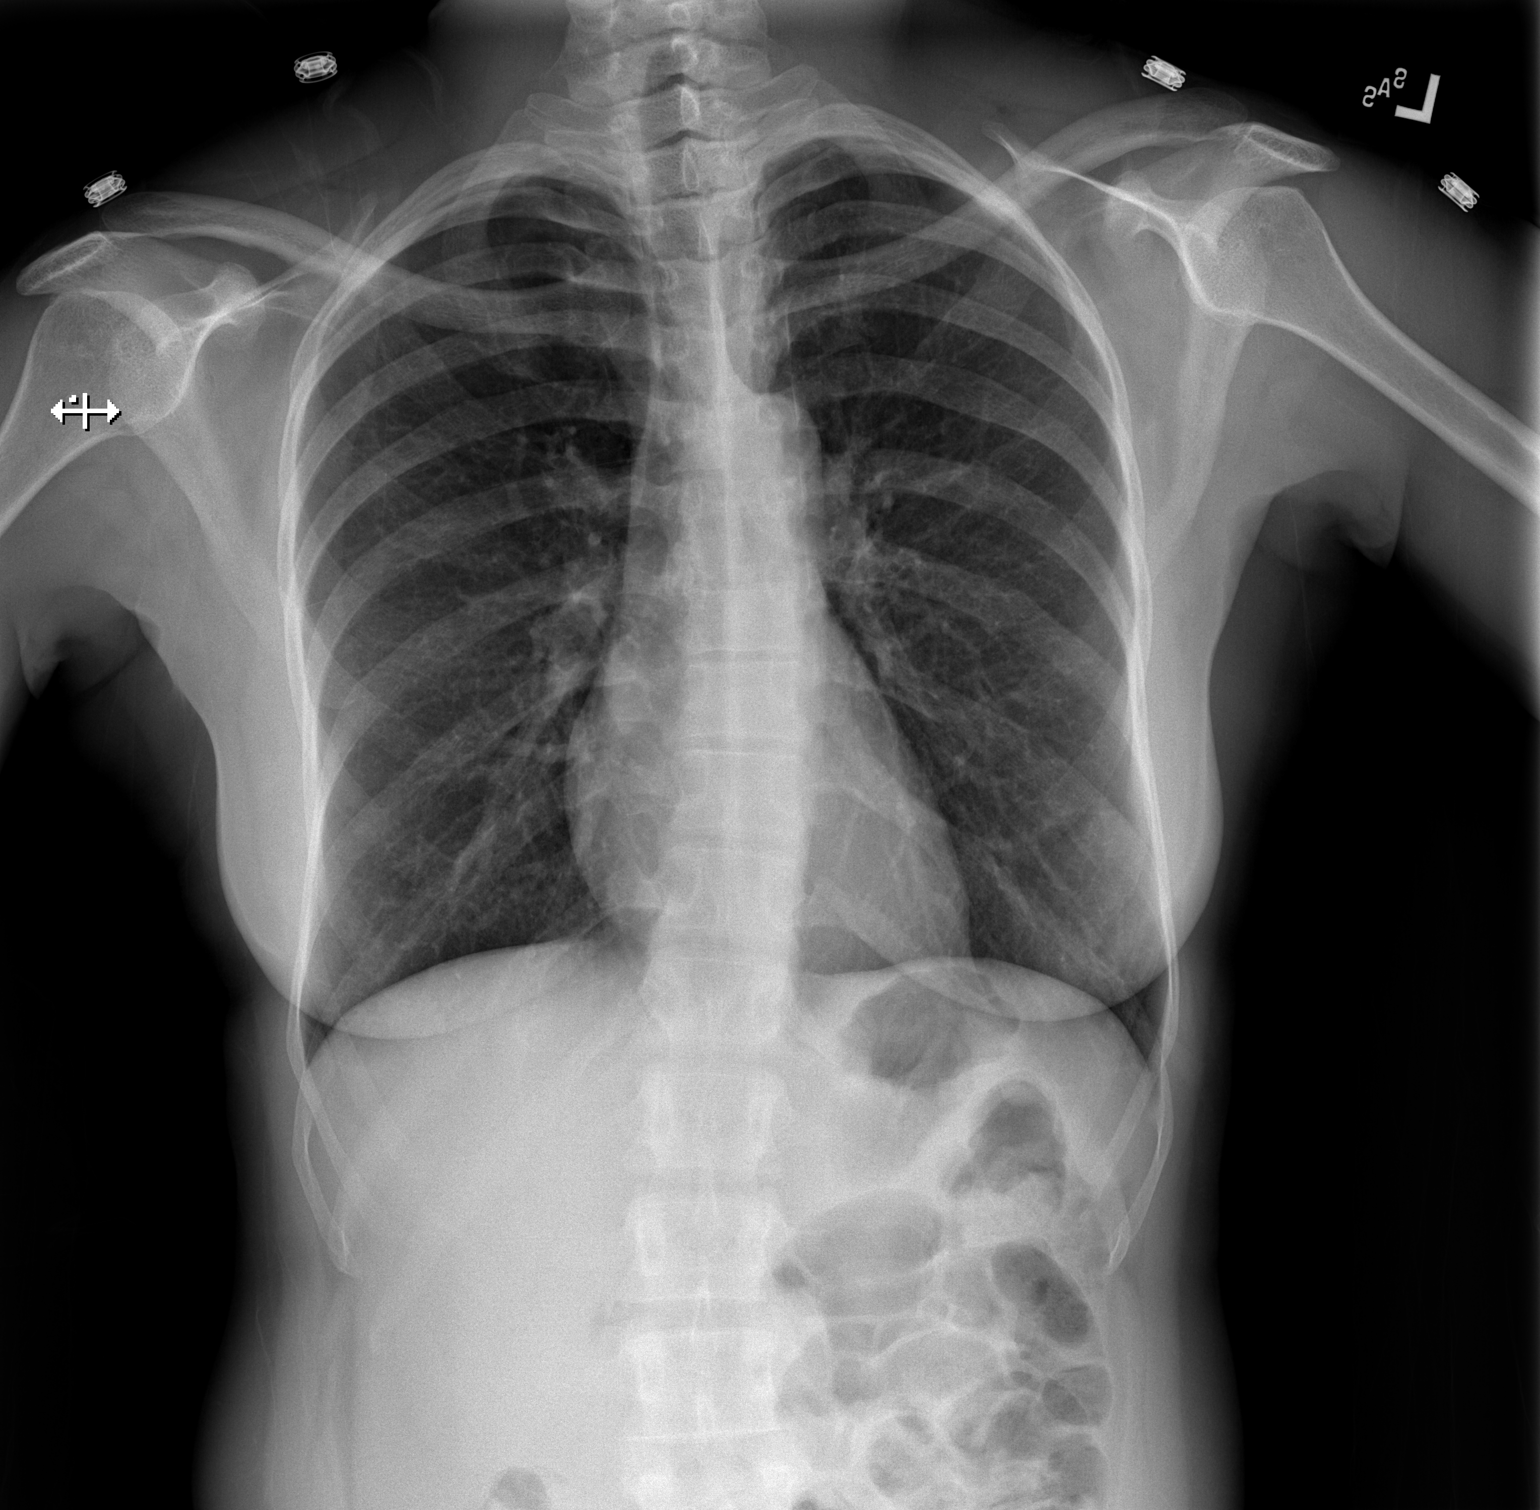

[w chest lat]
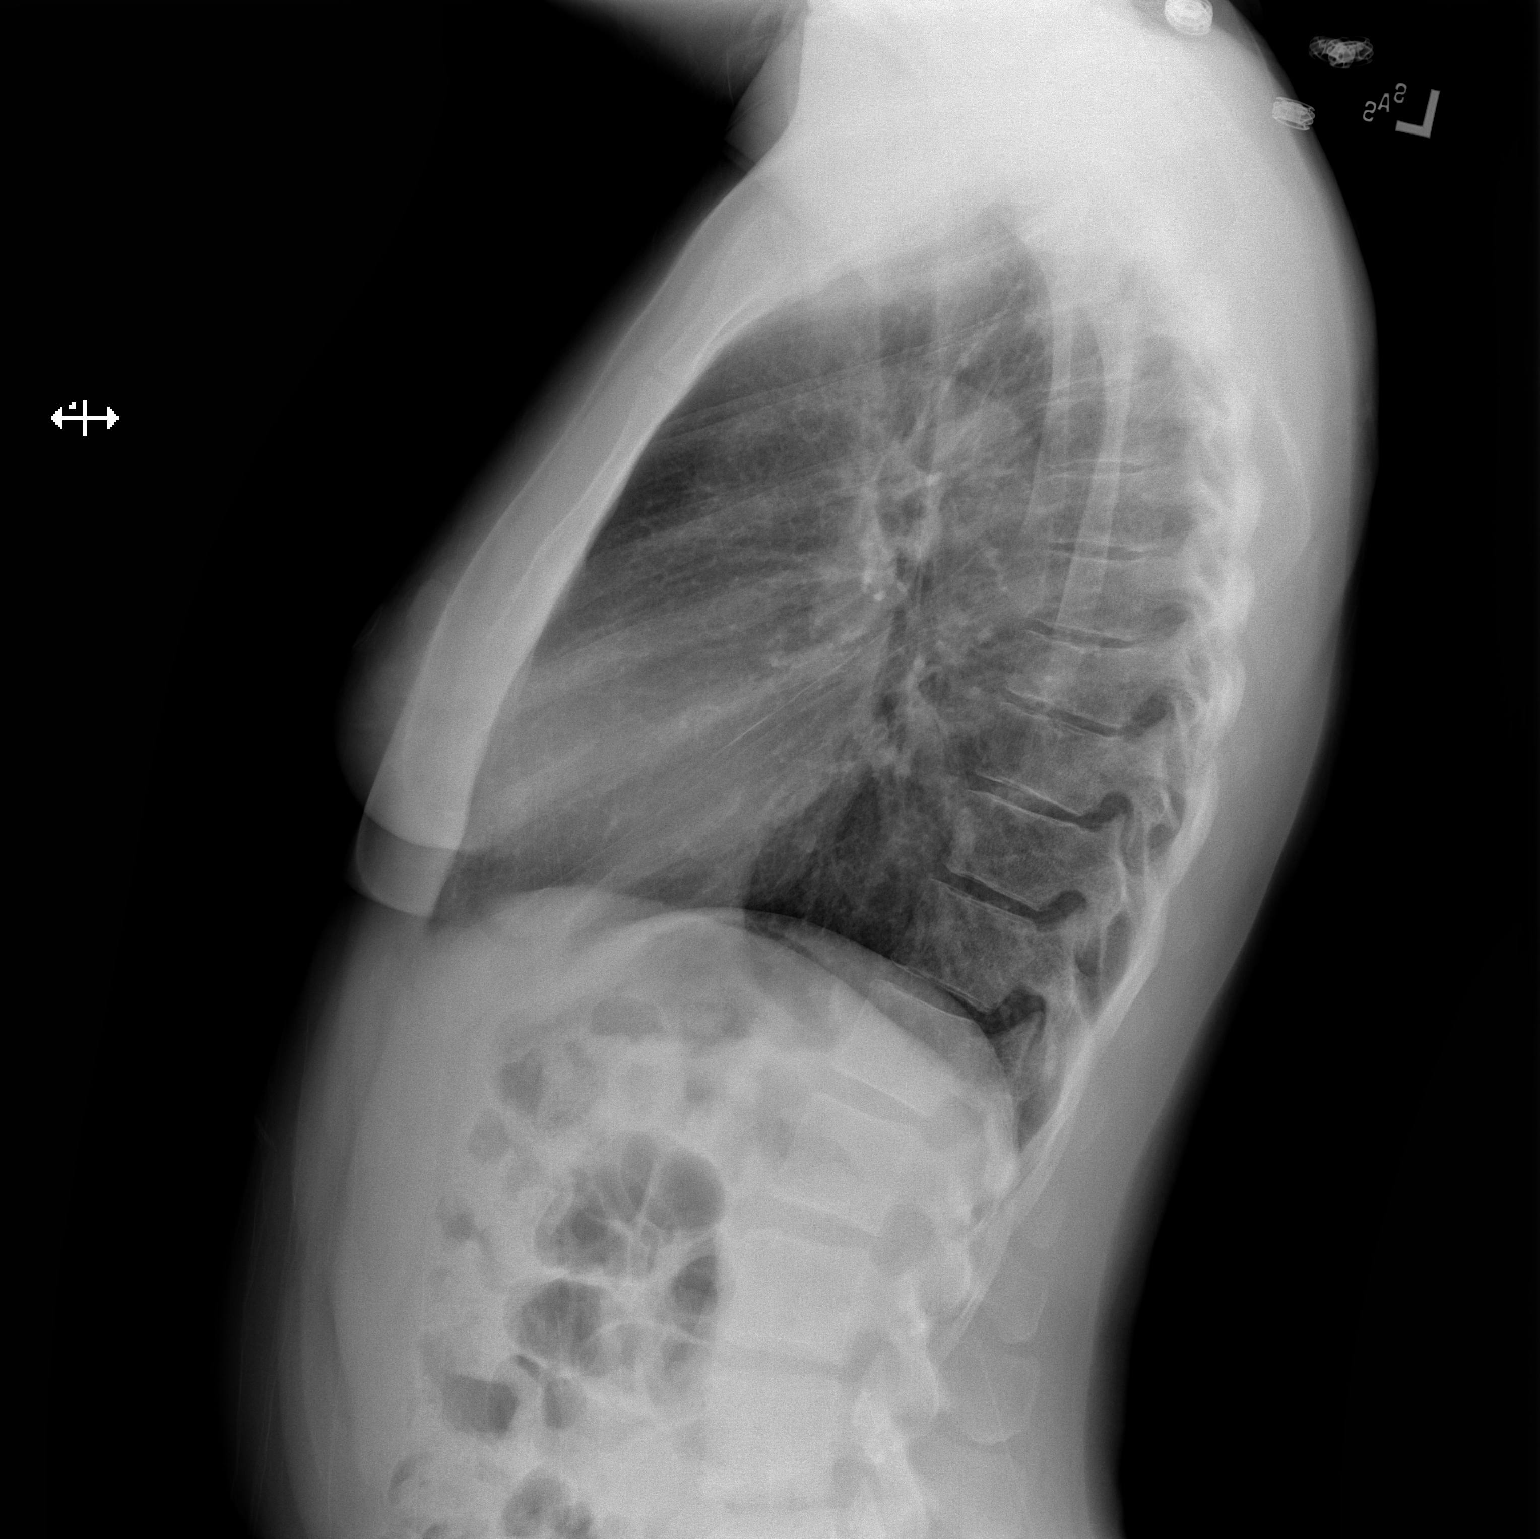

[2 of 2 positions shown; findings below may reference images not displayed]

FINDINGS: The heart size and mediastinal contours are within normal limits.
Both lungs are clear. Mild scoliosis.
IMPRESSION: No active cardiopulmonary disease.

## 2017-07-30 IMAGING — CR DG CHEST 2V
2 series · 2 of 2 positions shown · non-contrast
Comparison: Prior radiograph from 02/27/2017.

CLINICAL DATA: Initial evaluation for acute weakness, cough,
congestion.

EXAM:
CHEST  2 VIEW

[chest pa]
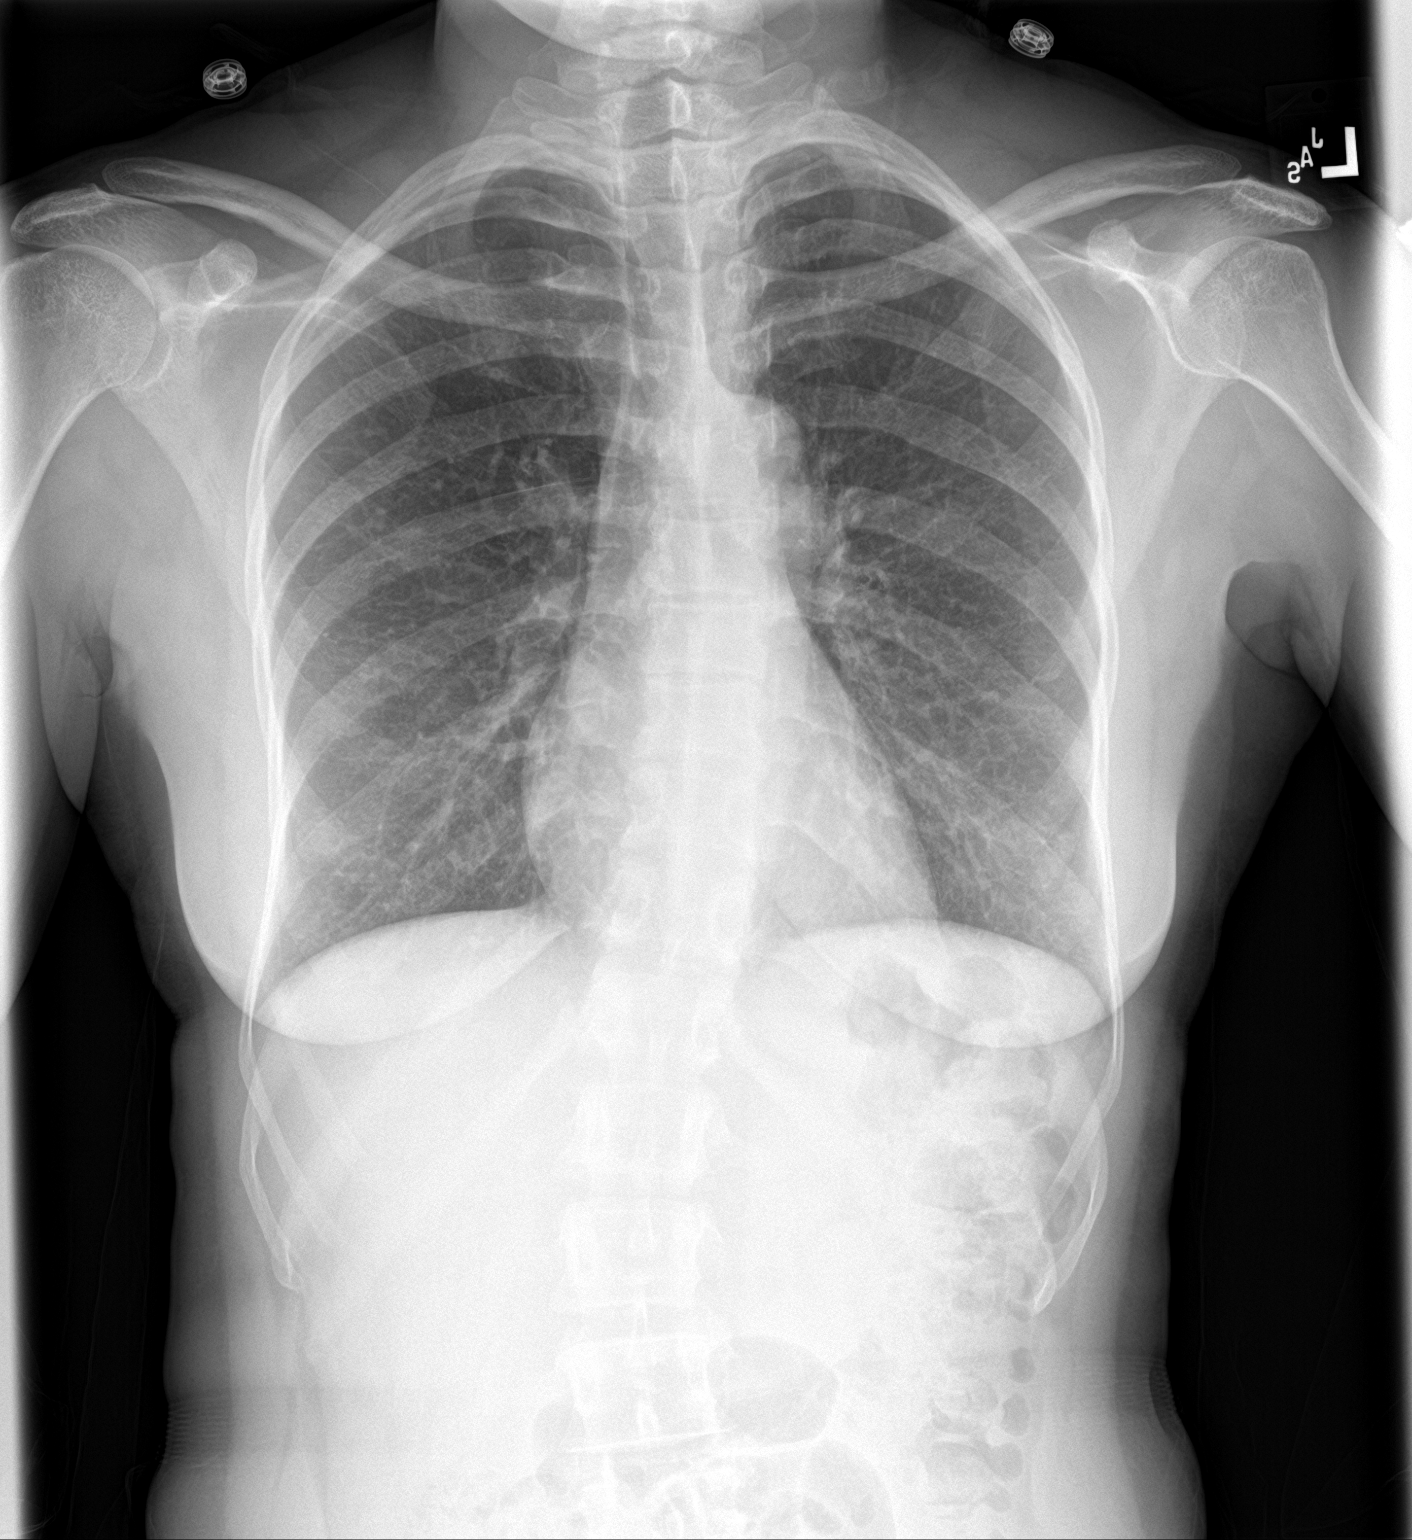

[chest lat]
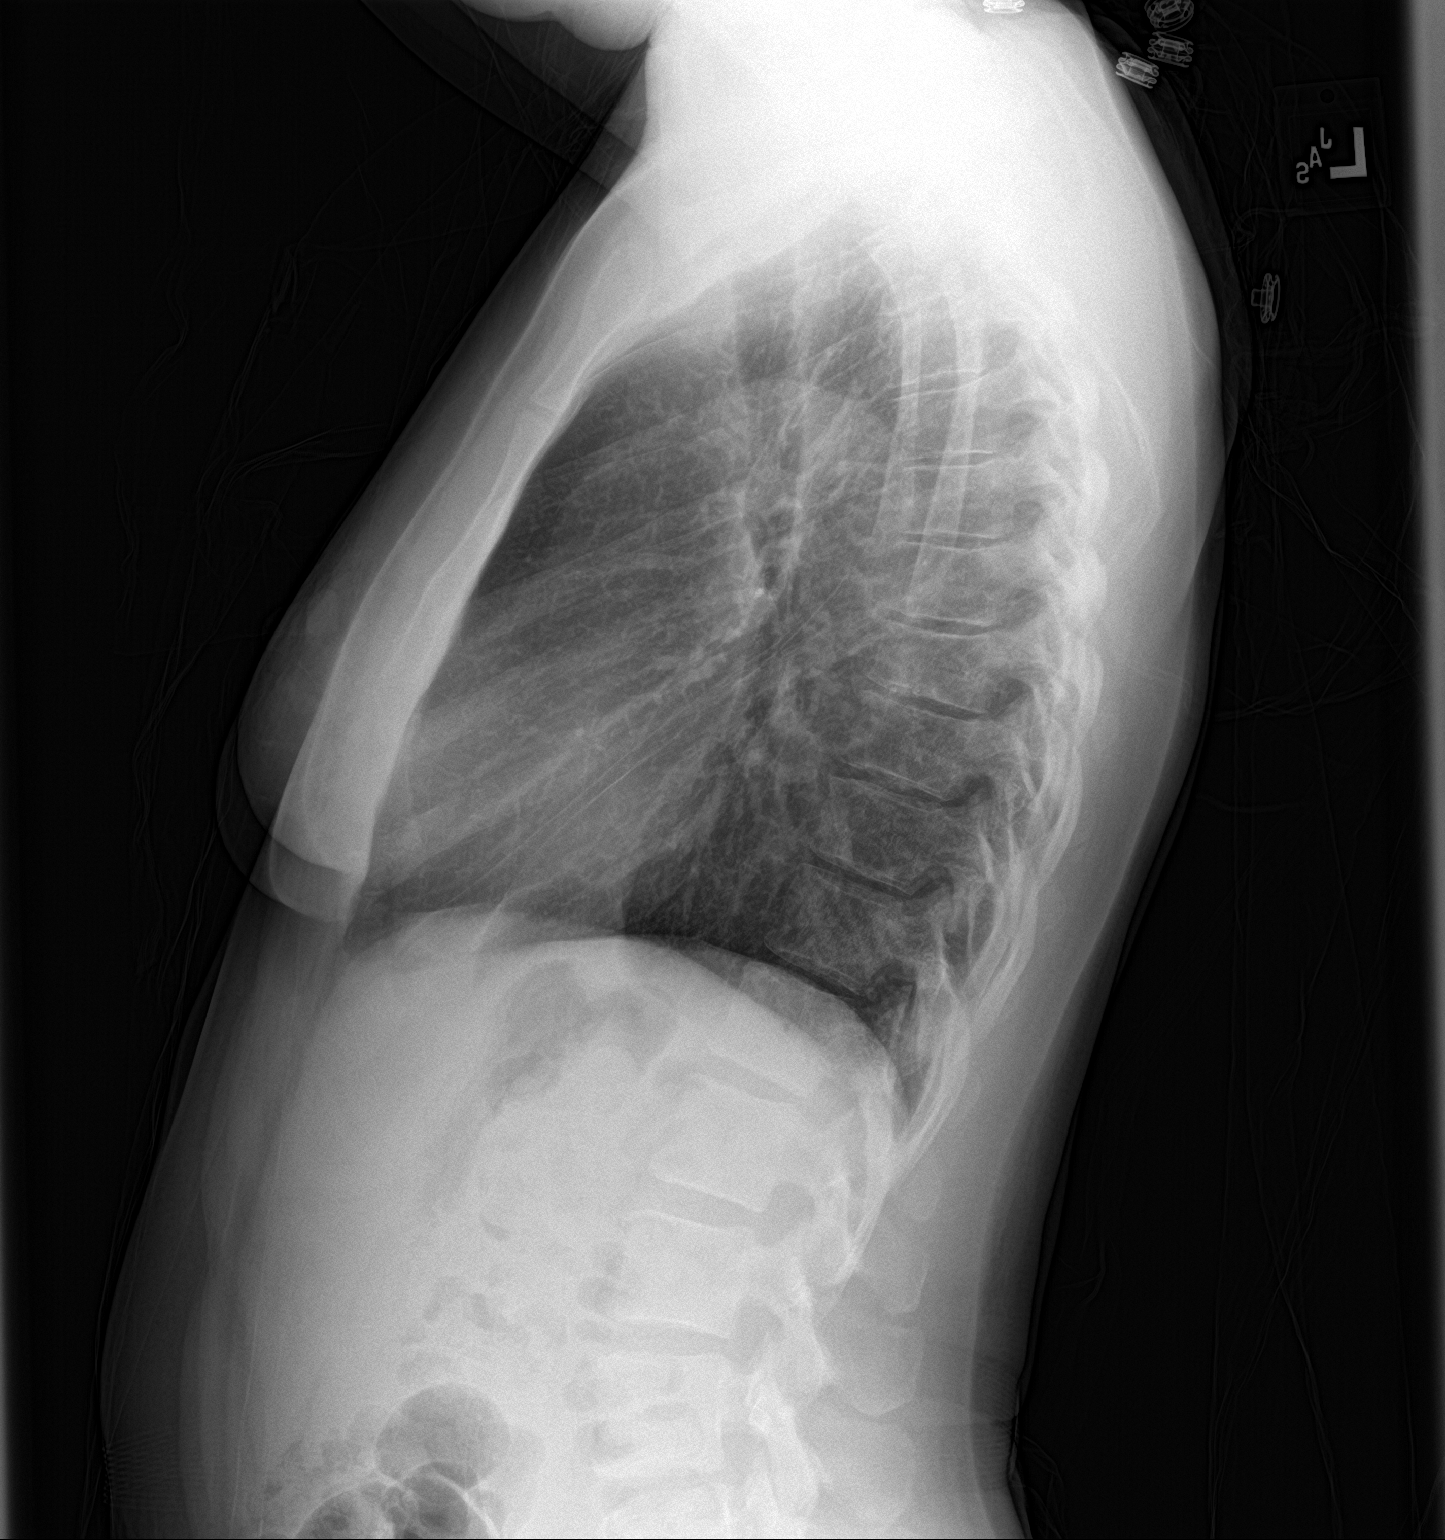

[2 of 2 positions shown; findings below may reference images not displayed]

FINDINGS: Cardiac and mediastinal silhouettes are stable in size and contour,
and remain within normal limits.

Lungs normally inflated. Mild scattered peribronchial thickening. No
focal infiltrates. No pulmonary edema or pleural effusion. No
pneumothorax.

No acute osseous abnormality.  Scoliosis noted.
IMPRESSION: Mild scattered peribronchial thickening, which may reflect sequelae
of acute bronchiolitis given the history of cough and congestion. No
focal infiltrates to suggest pneumonia.

## 2018-11-07 ENCOUNTER — Other Ambulatory Visit: Payer: Self-pay

## 2018-11-07 ENCOUNTER — Encounter (HOSPITAL_COMMUNITY): Payer: Self-pay | Admitting: *Deleted

## 2018-11-07 ENCOUNTER — Emergency Department (HOSPITAL_COMMUNITY)
Admission: EM | Admit: 2018-11-07 | Discharge: 2018-11-07 | Disposition: A | Discharge status: Home / Self Care | Payer: Self-pay | Attending: Emergency Medicine | Admitting: Emergency Medicine

## 2018-11-07 DIAGNOSIS — Z79899 Other long term (current) drug therapy: Secondary | ICD-10-CM | POA: Insufficient documentation

## 2018-11-07 DIAGNOSIS — M5432 Sciatica, left side: Secondary | ICD-10-CM | POA: Insufficient documentation

## 2018-11-07 MED ORDER — PREDNISONE 20 MG PO TABS: ORAL_TABLET | ORAL | 0 refills | Status: DC

## 2018-11-07 MED ORDER — DEXAMETHASONE SODIUM PHOSPHATE 10 MG/ML IJ SOLN
10.0000 mg | Freq: Once | INTRAMUSCULAR | Status: AC
  Administered 2018-11-07: 10 mg via INTRAMUSCULAR
  Filled 2018-11-07: qty 1

## 2018-11-07 MED ORDER — NAPROXEN 375 MG PO TABS: 375.0000 mg | ORAL_TABLET | Freq: Two times a day (BID) | ORAL | 0 refills | Status: DC

## 2018-11-07 MED ORDER — CYCLOBENZAPRINE HCL 10 MG PO TABS: 10.0000 mg | ORAL_TABLET | Freq: Two times a day (BID) | ORAL | 0 refills | Status: DC | PRN

## 2018-11-07 NOTE — ED Provider Notes (Signed)
[facility]   CSN: [id number] Arrival date & time: 11/07/18  1640     History   Chief Complaint Chief Complaint  Patient presents with  . Back Pain    HPI Amanda Keith is a 52 y.o. female.  The history is provided by the patient and medical records. No language interpreter was used.  Back Pain       52 year old female with history of scoliosis, substance abuse, schizophrenia, presenting complaining of radicular pain.  Patient report for the past 4 days she has had pain to her left lower back radiates down her left leg.  Pain is sharp, shooting, waxing and waning, worsening with movement.  She walks with a cane.  She tries over-the-counter pain medication and pain patch without adequate relief.  She felt that her pain felt similar to prior sciatica that was treated in the ER.  She denies any fever, chills, bowel bladder incontinence or saddle anesthesia.  She denies history of IV drug use active cancer.  She rates the pain is moderate in severity.  Past Medical History:  Diagnosis Date  . Abuse, drug or alcohol (HCC)   . Anxiety   . Asthma   . Schizo affective schizophrenia (HCC)   . Scoliosis     Patient Active Problem List   Diagnosis Date Noted  . Schizoaffective disorder, depressive type (HCC) 12/21/2016  . Cocaine abuse (HCC) 12/21/2016    Past Surgical History:  Procedure Laterality Date  . ABDOMINAL HYSTERECTOMY    . CESAREAN SECTION       OB History   None      Home Medications    Prior to Admission medications   Medication Sig Start Date End Date Taking? Authorizing Provider  albuterol (PROVENTIL HFA;VENTOLIN HFA) 108 (90 Base) MCG/ACT inhaler Inhale 1-2 puffs into the lungs every 6 (six) hours as needed for wheezing or shortness of breath. 03/07/17   Muthersbaugh, Dahlia ClientHannah, PA-C  Atorvastatin Calcium (LIPITOR PO) Take 1 tablet by mouth every evening.    [provider]    budesonide-formoterol (SYMBICORT) 80-4.5 MCG/ACT inhaler Inhale 2 puffs into the lungs 2 (two) times daily as needed (for asthma flares). 03/07/17   Muthersbaugh, Dahlia ClientHannah, PA-C  furosemide (LASIX) 20 MG tablet Take one tablet daily as needed for leg swelling. 03/19/17   Molpus, John, MD  gabapentin (NEURONTIN) 300 MG capsule Take 300 mg by mouth at bedtime.     [provider]  naproxen (NAPROSYN) 375 MG tablet Take 1 tablet (375 mg total) by mouth 2 (two) times daily. 03/17/17   Roxy HorsemanBrowning, Robert, PA-C  risperiDONE (RISPERDAL) 1 MG tablet Take 1 mg by mouth at bedtime.     [provider]  traZODone (DESYREL) 50 MG tablet Take 50 mg by mouth at bedtime as needed for sleep.    [provider]    Family History History reviewed. No pertinent family history.  Social History Social History   Tobacco Use  . Smoking status: Never Smoker  . Smokeless tobacco: Never Used  Substance Use Topics  . Alcohol use: No  . Drug use: Yes    Types: Cocaine    Comment: clean x 1 month     Allergies   Patient has no known allergies.   Review of Systems Review of Systems  Musculoskeletal: Positive for back pain.  All other systems reviewed and are negative.    Physical Exam Updated Vital Signs BP 92/69 (BP Location: Left Arm)  Pulse 70   Temp 98.1 F (36.7 C) (Oral)   Resp 16   Ht 5\' 4"  (1.626 m)   Wt 56.2 kg   LMP 10/10/2011   SpO2 100%   BMI 21.28 kg/m   Physical Exam  Constitutional: She appears well-developed and well-nourished. No distress.  HENT:  Head: Atraumatic.  Eyes: Conjunctivae are normal.  Neck: Neck supple.  Cardiovascular: Intact distal pulses.  Musculoskeletal: She exhibits tenderness (Tenderness to lumbar spine and left lumbosacral region on palpation with positive straight leg raise.  Patellar deep tendon reflex intact bilaterally without any foot drop.).  Neurological: She is alert.  Skin: No rash noted.  Psychiatric: She has a normal  mood and affect.  Nursing note and vitals reviewed.    ED Treatments / Results  Labs (all labs ordered are listed, but only abnormal results are displayed) Labs Reviewed - No data to display  EKG None  Radiology No results found.  Procedures Procedures (including critical care time)  Medications Ordered in ED Medications - No data to display   Initial Impression / Assessment and Plan / ED Course  I have reviewed the triage vital signs and the nursing notes.  Pertinent labs & imaging results that were available during my care of the patient were reviewed by me and considered in my medical decision making (see chart for details).     BP 92/69 (BP Location: Left Arm)   Pulse 70   Temp 98.1 F (36.7 C) (Oral)   Resp 16   Ht 5\' 4"  (1.626 m)   Wt 56.2 kg   LMP 10/10/2011   SpO2 100%   BMI 21.28 kg/m  BP soft, however pt is small stature and this is likely her baseline as compare to prior Vital signs from prior visits.  Final Clinical Impressions(s) / ED Diagnoses   Final diagnoses:  Sciatica, left side    ED Discharge Orders         Ordered    naproxen (NAPROSYN) 375 MG tablet  2 times daily     11/07/18 1758    predniSONE (DELTASONE) 20 MG tablet     11/07/18 1758    cyclobenzaprine (FLEXERIL) 10 MG tablet  2 times daily PRN     11/07/18 1758         5:39 PM Patient here with radicular left lower leg pain suggestive of sciatica.  This is an atraumatic injury.  No red flags.  She is able to ambulate.  Will provide symptomatic treatment as well as orthopedic referral.  Return precautions discussed.   Fayrene Helper, PA-C 11/07/18 Elberta Spaniel, MD 11/08/18 [phone]

## 2018-11-07 NOTE — ED Triage Notes (Signed)
PT reports lower back pain started last night. 

## 2018-11-07 NOTE — ED Notes (Signed)
Declined W/C at D/C and was escorted to lobby by RN. 

## 2018-12-17 ENCOUNTER — Emergency Department (HOSPITAL_COMMUNITY)
Admission: EM | Admit: 2018-12-17 | Discharge: 2018-12-17 | Discharge status: Against Medical Advice | Payer: Self-pay | Attending: Emergency Medicine | Admitting: Emergency Medicine

## 2018-12-17 DIAGNOSIS — R350 Frequency of micturition: Secondary | ICD-10-CM | POA: Insufficient documentation

## 2018-12-17 DIAGNOSIS — Z5321 Procedure and treatment not carried out due to patient leaving prior to being seen by health care provider: Secondary | ICD-10-CM | POA: Insufficient documentation

## 2018-12-17 DIAGNOSIS — R109 Unspecified abdominal pain: Secondary | ICD-10-CM | POA: Insufficient documentation

## 2018-12-17 NOTE — ED Notes (Signed)
Called pt 3 times in waiting room. No answer

## 2018-12-17 NOTE — ED Notes (Signed)
Called 3x in waiting room. No answer

## 2018-12-17 NOTE — ED Triage Notes (Signed)
Pt here with c/o left side abd pain /flank pain , pt does have some frequency with urination

## 2018-12-18 ENCOUNTER — Emergency Department (HOSPITAL_COMMUNITY)
Admission: EM | Admit: 2018-12-18 | Discharge: 2018-12-18 | Disposition: A | Discharge status: Home / Self Care | Payer: Self-pay | Attending: Emergency Medicine | Admitting: Emergency Medicine

## 2018-12-18 ENCOUNTER — Emergency Department (HOSPITAL_COMMUNITY): Payer: Self-pay

## 2018-12-18 ENCOUNTER — Encounter (HOSPITAL_COMMUNITY): Payer: Self-pay | Admitting: Emergency Medicine

## 2018-12-18 DIAGNOSIS — R202 Paresthesia of skin: Secondary | ICD-10-CM | POA: Insufficient documentation

## 2018-12-18 DIAGNOSIS — R109 Unspecified abdominal pain: Secondary | ICD-10-CM | POA: Insufficient documentation

## 2018-12-18 DIAGNOSIS — Z79899 Other long term (current) drug therapy: Secondary | ICD-10-CM | POA: Insufficient documentation

## 2018-12-18 LAB — URINALYSIS, ROUTINE W REFLEX MICROSCOPIC
BILIRUBIN URINE: NEGATIVE
Glucose, UA: NEGATIVE mg/dL
Hgb urine dipstick: NEGATIVE
KETONES UR: NEGATIVE mg/dL
LEUKOCYTES UA: NEGATIVE
Nitrite: NEGATIVE
PROTEIN: 30 mg/dL — AB
Specific Gravity, Urine: 1.017 (ref 1.005–1.030)
pH: 5 (ref 5.0–8.0)

## 2018-12-18 LAB — LIPASE, BLOOD: LIPASE: 61 U/L — AB (ref 11–51)

## 2018-12-18 LAB — CBC
HEMATOCRIT: 45.5 % (ref 36.0–46.0)
HEMOGLOBIN: 14.4 g/dL (ref 12.0–15.0)
MCH: 28.6 pg (ref 26.0–34.0)
MCHC: 31.6 g/dL (ref 30.0–36.0)
MCV: 90.3 fL (ref 80.0–100.0)
Platelets: 236 10*3/uL (ref 150–400)
RBC: 5.04 MIL/uL (ref 3.87–5.11)
RDW: 12.3 % (ref 11.5–15.5)
WBC: 5 10*3/uL (ref 4.0–10.5)
nRBC: 0 % (ref 0.0–0.2)

## 2018-12-18 LAB — I-STAT BETA HCG BLOOD, ED (MC, WL, AP ONLY)

## 2018-12-18 LAB — TSH: TSH: 1.725 u[IU]/mL (ref 0.350–4.500)

## 2018-12-18 LAB — COMPREHENSIVE METABOLIC PANEL
ALBUMIN: 4.3 g/dL (ref 3.5–5.0)
ALK PHOS: 48 U/L (ref 38–126)
ALT: 19 U/L (ref 0–44)
AST: 21 U/L (ref 15–41)
Anion gap: 9 (ref 5–15)
BUN: 17 mg/dL (ref 6–20)
CO2: 28 mmol/L (ref 22–32)
Calcium: 9.8 mg/dL (ref 8.9–10.3)
Chloride: 100 mmol/L (ref 98–111)
Creatinine, Ser: 1.02 mg/dL — ABNORMAL HIGH (ref 0.44–1.00)
GFR calc non Af Amer: >60 mL/min (ref >60)
Glucose, Bld: 103 mg/dL — ABNORMAL HIGH (ref 70–99)
POTASSIUM: 5 mmol/L (ref 3.5–5.1)
Sodium: 137 mmol/L (ref 135–145)
TOTAL PROTEIN: 8.4 g/dL — AB (ref 6.5–8.1)
Total Bilirubin: 0.3 mg/dL (ref 0.3–1.2)

## 2018-12-18 LAB — RAPID URINE DRUG SCREEN, HOSP PERFORMED
Amphetamines: NOT DETECTED
Barbiturates: NOT DETECTED
Benzodiazepines: NOT DETECTED
Cocaine: NOT DETECTED
Opiates: NOT DETECTED
TETRAHYDROCANNABINOL: NOT DETECTED

## 2018-12-18 MED ORDER — IOHEXOL 300 MG/ML  SOLN
100.0000 mL | Freq: Once | INTRAMUSCULAR | Status: AC | PRN
  Administered 2018-12-18: 100 mL via INTRAVENOUS

## 2018-12-18 NOTE — ED Notes (Signed)
Patient verbalizes understanding of discharge instructions. Opportunity for questioning and answers were provided. Armband removed by staff, pt discharged from ED. Pt given meal prior to d/c. Pt ambulatory to lobby.

## 2018-12-18 NOTE — ED Notes (Signed)
ED Provider at bedside. 

## 2018-12-18 NOTE — ED Notes (Signed)
Called x 1 no answer

## 2018-12-18 NOTE — ED Provider Notes (Addendum)
[facility]   CSN: [id number] Arrival date & time: 12/18/18  1201     History   Chief Complaint Chief Complaint  Patient presents with  . Tingling    HPI Amanda Keith is a 53 y.o. female.  HPI Patient presents with tingling in her arms and legs.  Also abdominal pain.  Has been going on for a while now.  States it feels kind of like her hands and feet feel tingly.  Has been going for a while but worse over the last few days.  Also complaining of abdominal pain.  Is in the left back and goes more to the abdomen.  Also is been going for a while but worse recently.  Patient denies drug abuse states she is in recovery however last use cocaineAround 3 weeks ago. Past Medical History:  Diagnosis Date  . Abuse, drug or alcohol (HCC)   . Anxiety   . Asthma   . Schizo affective schizophrenia (HCC)   . Scoliosis     Patient Active Problem List   Diagnosis Date Noted  . Schizoaffective disorder, depressive type (HCC) 12/21/2016  . Cocaine abuse (HCC) 12/21/2016    Past Surgical History:  Procedure Laterality Date  . ABDOMINAL HYSTERECTOMY    . CESAREAN SECTION       OB History   No obstetric history on file.      Home Medications    Prior to Admission medications   Medication Sig Start Date End Date Taking? Authorizing Provider  albuterol (PROVENTIL HFA;VENTOLIN HFA) 108 (90 Base) MCG/ACT inhaler Inhale 1-2 puffs into the lungs every 6 (six) hours as needed for wheezing or shortness of breath. 03/07/17   Muthersbaugh, Dahlia ClientHannah, PA-C  Atorvastatin Calcium (LIPITOR PO) Take 1 tablet by mouth every evening.    [provider]  budesonide-formoterol (SYMBICORT) 80-4.5 MCG/ACT inhaler Inhale 2 puffs into the lungs 2 (two) times daily as needed (for asthma flares). 03/07/17   Muthersbaugh, Dahlia ClientHannah, PA-C  cyclobenzaprine (FLEXERIL) 10 MG tablet Take 1 tablet (10 mg total) by mouth 2 (two) times daily as needed for muscle  spasms. 11/07/18   Fayrene Helperran, Bowie, PA-C  furosemide (LASIX) 20 MG tablet Take one tablet daily as needed for leg swelling. 03/19/17   Molpus, John, MD  gabapentin (NEURONTIN) 300 MG capsule Take 300 mg by mouth at bedtime.     [provider]  naproxen (NAPROSYN) 375 MG tablet Take 1 tablet (375 mg total) by mouth 2 (two) times daily. 11/07/18   Fayrene Helperran, Bowie, PA-C  predniSONE (DELTASONE) 20 MG tablet 3 tabs po day one, then 2 tabs daily x 4 days 11/07/18   Fayrene Helperran, Bowie, PA-C  risperiDONE (RISPERDAL) 1 MG tablet Take 1 mg by mouth at bedtime.     [provider]  traZODone (DESYREL) 50 MG tablet Take 50 mg by mouth at bedtime as needed for sleep.    [provider]    Family History History reviewed. No pertinent family history.  Social History Social History   Tobacco Use  . Smoking status: Never Smoker  . Smokeless tobacco: Never Used  Substance Use Topics  . Alcohol use: No  . Drug use: Yes    Types: Cocaine    Comment: clean x 1 month     Allergies   Patient has no known allergies.   Review of Systems Review of Systems  Constitutional: Negative for appetite change and unexpected weight change.  HENT: Negative for congestion.  Respiratory: Negative for shortness of breath.   Cardiovascular: Negative for chest pain.  Gastrointestinal: Positive for abdominal pain.  Genitourinary: Negative for flank pain.  Musculoskeletal: Negative for back pain.  Skin: Negative for rash.  Neurological: Positive for numbness. Negative for tremors and weakness.  Psychiatric/Behavioral: Negative for confusion.     Physical Exam Updated Vital Signs BP 103/65   Pulse 90   Temp 98 F (36.7 C) (Oral)   Resp 14   Ht 5\' 3"  (1.6 m)   Wt 49.9 kg   LMP 10/10/2011   SpO2 100%   BMI 19.49 kg/m   Physical Exam HENT:     Head: Normocephalic.  Eyes:     Extraocular Movements: Extraocular movements intact.  Cardiovascular:     Rate and Rhythm: Normal rate and  regular rhythm.  Pulmonary:     Breath sounds: No wheezing or rhonchi.  Abdominal:     Tenderness: There is abdominal tenderness.     Comments: Left lower quadrant tenderness without rebound or guarding.  Musculoskeletal:     Right lower leg: No edema.     Left lower leg: No edema.  Skin:    General: Skin is warm.     Capillary Refill: Capillary refill takes less than 2 seconds.  Neurological:     General: No focal deficit present.     Mental Status: She is alert.     Comments: Sensation is intact bilateral upper and lower extremities.  Good grip strength bilaterally.  Radial median ulnar sensation intact.  States that she has had some strange feeling down the outside of her arm down to the fifth finger but normal exam.  Psychiatric:        Mood and Affect: Mood normal.      ED Treatments / Results  Labs (all labs ordered are listed, but only abnormal results are displayed) Labs Reviewed  LIPASE, BLOOD - Abnormal; Notable for the following components:      Result Value   Lipase 61 (*)    All other components within normal limits  COMPREHENSIVE METABOLIC PANEL - Abnormal; Notable for the following components:   Glucose, Bld 103 (*)    Creatinine, Ser 1.02 (*)    Total Protein 8.4 (*)    All other components within normal limits  URINALYSIS, ROUTINE W REFLEX MICROSCOPIC - Abnormal; Notable for the following components:   APPearance HAZY (*)    Protein, ur 30 (*)    Bacteria, UA RARE (*)    All other components within normal limits  CBC  TSH  RAPID URINE DRUG SCREEN, HOSP PERFORMED  I-STAT BETA HCG BLOOD, ED ([facility], [facility], [facility] ONLY)    EKG None  Radiology No results found.  Procedures Procedures (including critical care time)  Medications Ordered in ED Medications  iohexol (OMNIPAQUE) 300 MG/ML solution 100 mL (100 mLs Intravenous Contrast Given 12/18/18 1509)     Initial Impression / Assessment and Plan / ED Course  I have reviewed the triage vital signs and the  nursing notes.  Pertinent labs & imaging results that were available during my care of the patient were reviewed by me and considered in my medical decision making (see chart for details).     Patient with paresthesias in upper and lower extremities.  Has abdominal pain also.  Lab work overall reassuring.  CT scan done due to persistent abdominal pain with left-sided tenderness.  Care turned over to Dr. Silverio LayYao.  CT scan back and reassuring.  Discharge home.  Final Clinical Impressions(s) / ED Diagnoses   Final diagnoses:  Paresthesia  Abdominal pain, unspecified abdominal location    ED Discharge Orders    None       Benjiman Core, MD 12/18/18 1528    Benjiman Core, MD 12/18/18 [phone]

## 2018-12-18 NOTE — ED Triage Notes (Signed)
Pt states she has been having numbness/tingling in bilateral lower legs and right arm for three days Denies weakness, denies difficulty speaking. Pt also complains of sharp lower abd pain. Denies n/v/diarrhea

## 2018-12-23 ENCOUNTER — Encounter (HOSPITAL_COMMUNITY): Payer: Self-pay

## 2018-12-23 ENCOUNTER — Emergency Department (HOSPITAL_COMMUNITY)
Admission: EM | Admit: 2018-12-23 | Discharge: 2018-12-23 | Disposition: A | Discharge status: Home / Self Care | Payer: Self-pay | Attending: Emergency Medicine | Admitting: Emergency Medicine

## 2018-12-23 ENCOUNTER — Other Ambulatory Visit: Payer: Self-pay

## 2018-12-23 DIAGNOSIS — G8929 Other chronic pain: Secondary | ICD-10-CM | POA: Insufficient documentation

## 2018-12-23 DIAGNOSIS — M419 Scoliosis, unspecified: Secondary | ICD-10-CM | POA: Insufficient documentation

## 2018-12-23 DIAGNOSIS — F419 Anxiety disorder, unspecified: Secondary | ICD-10-CM | POA: Insufficient documentation

## 2018-12-23 DIAGNOSIS — M5442 Lumbago with sciatica, left side: Secondary | ICD-10-CM | POA: Insufficient documentation

## 2018-12-23 DIAGNOSIS — F209 Schizophrenia, unspecified: Secondary | ICD-10-CM | POA: Insufficient documentation

## 2018-12-23 DIAGNOSIS — Z79899 Other long term (current) drug therapy: Secondary | ICD-10-CM | POA: Insufficient documentation

## 2018-12-23 DIAGNOSIS — F1410 Cocaine abuse, uncomplicated: Secondary | ICD-10-CM | POA: Insufficient documentation

## 2018-12-23 DIAGNOSIS — J45909 Unspecified asthma, uncomplicated: Secondary | ICD-10-CM | POA: Insufficient documentation

## 2018-12-23 MED ORDER — METHOCARBAMOL 500 MG PO TABS: 500.0000 mg | ORAL_TABLET | Freq: Two times a day (BID) | ORAL | 0 refills | Status: DC

## 2018-12-23 MED ORDER — DEXAMETHASONE SODIUM PHOSPHATE 10 MG/ML IJ SOLN
10.0000 mg | Freq: Once | INTRAMUSCULAR | Status: AC
  Administered 2018-12-23: 10 mg via INTRAMUSCULAR
  Filled 2018-12-23: qty 1

## 2018-12-23 NOTE — ED Provider Notes (Signed)
[facility]-EMERGENCY DEPT Provider Note   CSN: [id number] Arrival date & time: 12/23/18  1024     History   Chief Complaint Chief Complaint  Patient presents with  . Back Pain    HPI Amanda Keith is a 53 y.o. female has been history of asthma, anxiety, schizoaffective schizophrenia, scoliosis who presents for evaluation of continued back pain.  Patient reports that she has chronic back pain that intermittently will flareup.  Patient reports that over the last several weeks, she has had her persistent left lower back pain.  She states that she has recently been having to stay in the hospital with her fianc and has been sleeping on chairs which she feels like has been exacerbating her pain.  Patient reports she was seen here in the ED last week and had imaging done that was unremarkable.  Patient states that she continues to have pain.  She reports she was taking gabapentin for pain but ran out of prescription.  She reports that the pain is worse with movement.  She is still been able to ambulate despite pain.  She reports that the pain runs down the posterior aspect of her left lower extremity.  Patient states she has not had any new trauma, injury, fall.  Patient will occasionally have some paresthesias noted in her hands and feet but notes that this is chronic in nature.  No numbness/weakness of her extremities. Denies fevers, weight loss, numbness/weakness of upper and lower extremities, bowel/bladder incontinence, saddle anesthesia, history of back surgery, history of IVDA.   The history is provided by the patient.    Past Medical History:  Diagnosis Date  . Abuse, drug or alcohol (HCC)   . Anxiety   . Asthma   . Schizo affective schizophrenia (HCC)   . Scoliosis     Patient Active Problem List   Diagnosis Date Noted  . Schizoaffective disorder, depressive type (HCC) 12/21/2016  . Cocaine abuse (HCC) 12/21/2016    Past Surgical History:  Procedure  Laterality Date  . ABDOMINAL HYSTERECTOMY    . CESAREAN SECTION       OB History   No obstetric history on file.      Home Medications    Prior to Admission medications   Medication Sig Start Date End Date Taking? Authorizing Provider  albuterol (PROVENTIL HFA;VENTOLIN HFA) 108 (90 Base) MCG/ACT inhaler Inhale 1-2 puffs into the lungs every 6 (six) hours as needed for wheezing or shortness of breath. 03/07/17   Muthersbaugh, Dahlia Client, PA-C  Atorvastatin Calcium (LIPITOR PO) Take 1 tablet by mouth every evening.    [provider]  budesonide-formoterol (SYMBICORT) 80-4.5 MCG/ACT inhaler Inhale 2 puffs into the lungs 2 (two) times daily as needed (for asthma flares). 03/07/17   Muthersbaugh, Dahlia Client, PA-C  cyclobenzaprine (FLEXERIL) 10 MG tablet Take 1 tablet (10 mg total) by mouth 2 (two) times daily as needed for muscle spasms. 11/07/18   Fayrene Helper, PA-C  furosemide (LASIX) 20 MG tablet Take one tablet daily as needed for leg swelling. 03/19/17   Molpus, John, MD  gabapentin (NEURONTIN) 300 MG capsule Take 300 mg by mouth at bedtime.     [provider]  methocarbamol (ROBAXIN) 500 MG tablet Take 1 tablet (500 mg total) by mouth 2 (two) times daily. 12/23/18   Maxwell Caul, PA-C  naproxen (NAPROSYN) 375 MG tablet Take 1 tablet (375 mg total) by mouth 2 (two) times daily. 11/07/18   Fayrene Helper, PA-C  predniSONE (DELTASONE) 20  MG tablet 3 tabs po day one, then 2 tabs daily x 4 days 11/07/18   Fayrene Helper, PA-C  risperiDONE (RISPERDAL) 1 MG tablet Take 1 mg by mouth at bedtime.     [provider]  traZODone (DESYREL) 50 MG tablet Take 50 mg by mouth at bedtime as needed for sleep.    [provider]    Family History No family history on file.  Social History Social History   Tobacco Use  . Smoking status: Never Smoker  . Smokeless tobacco: Never Used  Substance Use Topics  . Alcohol use: No  . Drug use: Yes    Types: Cocaine    Comment:  clean x 1 month     Allergies   Patient has no known allergies.   Review of Systems Review of Systems  Constitutional: Negative for fever.  Musculoskeletal: Positive for back pain. Negative for neck pain.  Neurological: Negative for weakness and numbness.  All other systems reviewed and are negative.    Physical Exam Updated Vital Signs BP 96/70 (BP Location: Right Arm)   Pulse 60   Temp 98.3 F (36.8 C) (Oral)   Resp 15   Ht 5\' 3"  (1.6 m)   Wt 50 kg   LMP 10/10/2011   SpO2 100%   BMI 19.53 kg/m   Physical Exam Vitals signs and nursing note reviewed.  Constitutional:      Appearance: She is well-developed.  HENT:     Head: Normocephalic and atraumatic.  Eyes:     General: No scleral icterus.       Right eye: No discharge.        Left eye: No discharge.     Conjunctiva/sclera: Conjunctivae normal.  Neck:     Musculoskeletal: Full passive range of motion without pain.     Comments: Full flexion/extension and lateral movement of neck fully intact. No bony midline tenderness. No deformities or crepitus.  Pulmonary:     Effort: Pulmonary effort is normal.  Musculoskeletal:     Thoracic back: She exhibits no tenderness.     Lumbar back: She exhibits no tenderness.       Back:     Comments: No midline T or L-spine tenderness.  No deformity or crepitus noted.  Diffuse tenderness noted to paraspinal muscles of the left lower lumbar region.  No overlying warmth, erythema.  No rash.  Skin:    General: Skin is warm and dry.  Neurological:     Mental Status: She is alert.     Comments: Follows commands, Moves all extremities  5/5 strength to LUE and BLE.  Slightly diminished strength noted to right upper extremity which patient states is her baseline. Sensation intact throughout all major nerve distributions Positive SLR  Psychiatric:        Speech: Speech normal.        Behavior: Behavior normal.      ED Treatments / Results  Labs (all labs ordered are  listed, but only abnormal results are displayed) Labs Reviewed - No data to display  EKG None  Radiology No results found.  Procedures Procedures (including critical care time)  Medications Ordered in ED Medications  dexamethasone (DECADRON) injection 10 mg (10 mg Intramuscular Given 12/23/18 1249)     Initial Impression / Assessment and Plan / ED Course  I have reviewed the triage vital signs and the nursing notes.  Pertinent labs & imaging results that were available during my care of the patient were reviewed  by me and considered in my medical decision making (see chart for details).     53 year old past medical history of anxiety, schizoaffective schizophrenia, polysubstance abuse who presents for evaluation of left lower back pain.  Reports back pain is a chronic issue.  No new trauma, injury, fall.  Does report she has been sleeping in a chair in the hospital which has been contributing to her pain.  Recently ran out of her gabapentin prescription.  No red flags, no neuro deficits noted on exam. Patient is afebrile, non-toxic appearing, sitting comfortably on examination table. Vital signs reviewed and stable.  Patient is slightly lower on blood pressure and spectrum.  Review of her previous vitals from visit show she is normally slightly low.  Exam, diffuse tenderness palpation noted paraspinal muscles of the left lumbar region.  As well as positive straight leg raise on left side.  Concern for sciatica.  History/physical exam not concerning for cauda equina, spinal abscess.  At this time, no indication for acute MRI imaging here in the emergency department.  Patient requesting repeat x-ray.  I discussed the patient she recently had a CT that showed her lumbar spine.  She has no new trauma, injury, fall.  No indication for new imaging at this time.  Plan for Decadron shot here in the ED. At this time, patient exhibits no emergent life-threatening condition that require further  evaluation in ED or admission. Patient had ample opportunity for questions and discussion. All patient's questions were answered with full understanding. Strict return precautions discussed. Patient expresses understanding and agreement to plan.   Portions of this note were generated with Scientist, clinical (histocompatibility and immunogenetics)Dragon dictation software. Dictation errors may occur despite best attempts at proofreading.    Final Clinical Impressions(s) / ED Diagnoses   Final diagnoses:  Chronic left-sided low back pain with left-sided sciatica    ED Discharge Orders         Ordered    methocarbamol (ROBAXIN) 500 MG tablet  2 times daily     12/23/18 1239           Maxwell CaulLayden, Lindsey A, PA-C 12/23/18 1332    Virgina NorfolkCuratolo, Adam, DO 12/23/18 1916

## 2018-12-23 NOTE — Discharge Instructions (Signed)
You can take Tylenol or Ibuprofen as directed for pain. You can alternate Tylenol and Ibuprofen every 4 hours. If you take Tylenol at 1pm, then you can take Ibuprofen at 5pm. Then you can take Tylenol again at 9pm.   Take Robaxin as prescribed. This medication will make you drowsy so do not drive or drink alcohol when taking it.  As we discussed you can apply hot compresses to help with the pain.  Additionally, stretching will help with the pain.  Follow-up with [facility] to establish primary care and get help filling her prescription.  Return to the Emergency Department immediately for any worsening back pain, neck pain, difficulty walking, numbness/weaknss of your arms or legs, urinary or bowel accidents, fever or any other worsening or concerning symptoms.

## 2018-12-23 NOTE — ED Triage Notes (Addendum)
Pt reports left sided lower back pain. Pt states that this is a chronic problem, but got worse last night. Pt states that she normally takes medication (pt states she ran out of gabapentin), but has not taken it in the last 24 hours.  Pt states steroid shots have helped in the past with her pain.

## 2018-12-23 NOTE — ED Notes (Signed)
Bed: WTR5 Expected date:  Expected time:  Means of arrival:  Comments: 

## 2018-12-30 ENCOUNTER — Other Ambulatory Visit: Payer: Self-pay

## 2018-12-30 ENCOUNTER — Emergency Department (HOSPITAL_COMMUNITY): Payer: Self-pay

## 2018-12-30 ENCOUNTER — Emergency Department (HOSPITAL_COMMUNITY)
Admission: EM | Admit: 2018-12-30 | Discharge: 2018-12-30 | Disposition: A | Discharge status: Home / Self Care | Payer: Self-pay | Attending: Emergency Medicine | Admitting: Emergency Medicine

## 2018-12-30 DIAGNOSIS — G8929 Other chronic pain: Secondary | ICD-10-CM

## 2018-12-30 DIAGNOSIS — Z79899 Other long term (current) drug therapy: Secondary | ICD-10-CM | POA: Insufficient documentation

## 2018-12-30 DIAGNOSIS — M25572 Pain in left ankle and joints of left foot: Secondary | ICD-10-CM | POA: Insufficient documentation

## 2018-12-30 DIAGNOSIS — J45909 Unspecified asthma, uncomplicated: Secondary | ICD-10-CM | POA: Insufficient documentation

## 2018-12-30 DIAGNOSIS — F1410 Cocaine abuse, uncomplicated: Secondary | ICD-10-CM | POA: Insufficient documentation

## 2018-12-30 DIAGNOSIS — M79672 Pain in left foot: Secondary | ICD-10-CM

## 2018-12-30 DIAGNOSIS — M25571 Pain in right ankle and joints of right foot: Secondary | ICD-10-CM | POA: Insufficient documentation

## 2018-12-30 MED ORDER — NAPROXEN 375 MG PO TABS: 375.0000 mg | ORAL_TABLET | Freq: Two times a day (BID) | ORAL | 0 refills | Status: DC

## 2018-12-30 NOTE — ED Triage Notes (Signed)
Per patient states B/L Ankle pain and left foot pain-states she has tendonitis-unable to see a podiatrist due to no insurance-no injury-no swelling or deformity noted

## 2018-12-30 NOTE — ED Notes (Signed)
EDPA Provider at bedside. 

## 2018-12-30 NOTE — Discharge Instructions (Addendum)
Evaluated today for foot pain.  X-rays were negative.  I provided her with braces for her ankles.  I have given you anti-inflammatories for your foot pain.  Please follow-up with Orthopedics for reevaluation.

## 2018-12-30 NOTE — ED Notes (Signed)
ORTHO TECH AT BEDSIDE PLACING ASO SPLINT

## 2018-12-30 NOTE — ED Provider Notes (Signed)
[facility]-EMERGENCY DEPT Provider Note   CSN: [id number] Arrival date & time: 12/30/18  0841   History   Chief Complaint Chief Complaint  Patient presents with  . Foot Pain    HPI Amanda Keith is a 53 y.o. female with past medical history significant for polysubstance abuse, asthma, schizophrenia who presents for evaluation of foot pain.  Patient states she has recently been having flareups of her tendinitis in her bilateral ankles.  Patient states she was given ankle braces in the emergency department for this last year which help with the pain, however states that she lost these.  Patient states she is also had pain located in the dorsum of her foot.  Pain is described as a burning sensation and radiates down through toes 3 and 4.  Patient states this is worse when she walks. Denies injuries or trauma to foot. Not taking anything for symptoms PTA.  Pain is intermittent in nature.  Denies aggravating or alleviating factors.  She states her ankle pain is chronic at baseline has not changed, however would like if we could provide her with ankle braces as this is helped previously.  States she cannot afford these outpatient.  Able to ambulate however has pain to left foot.  Denies history of IV drug use.  Denies fever, chills, nausea, vomiting, decreased range of motion, numbness in her extremities.  Has been ambulatory however has pain.  Walks with a cane at baseline.  History obtained from patient.  No interpreter was used.  HPI  Past Medical History:  Diagnosis Date  . Abuse, drug or alcohol (HCC)   . Anxiety   . Asthma   . Schizo affective schizophrenia (HCC)   . Scoliosis     Patient Active Problem List   Diagnosis Date Noted  . Schizoaffective disorder, depressive type (HCC) 12/21/2016  . Cocaine abuse (HCC) 12/21/2016    Past Surgical History:  Procedure Laterality Date  . ABDOMINAL HYSTERECTOMY    . CESAREAN SECTION       OB History   No  obstetric history on file.      Home Medications    Prior to Admission medications   Medication Sig Start Date End Date Taking? Authorizing Provider  albuterol (PROVENTIL HFA;VENTOLIN HFA) 108 (90 Base) MCG/ACT inhaler Inhale 1-2 puffs into the lungs every 6 (six) hours as needed for wheezing or shortness of breath. Patient not taking: Reported on 12/30/2018 03/07/17   Muthersbaugh, Dahlia Client, PA-C  Atorvastatin Calcium (LIPITOR PO) Take 1 tablet by mouth every evening.    [provider]  budesonide-formoterol (SYMBICORT) 80-4.5 MCG/ACT inhaler Inhale 2 puffs into the lungs 2 (two) times daily as needed (for asthma flares). Patient not taking: Reported on 12/30/2018 03/07/17   Muthersbaugh, Dahlia Client, PA-C  cholecalciferol (VITAMIN D) 25 MCG (1000 UT) tablet Take 1,000 Units by mouth daily.    [provider]  cyclobenzaprine (FLEXERIL) 10 MG tablet Take 1 tablet (10 mg total) by mouth 2 (two) times daily as needed for muscle spasms. Patient not taking: Reported on 12/30/2018 11/07/18   Fayrene Helper, PA-C  furosemide (LASIX) 20 MG tablet Take one tablet daily as needed for leg swelling. Patient not taking: Reported on 12/30/2018 03/19/17   Molpus, John, MD  gabapentin (NEURONTIN) 300 MG capsule Take 300 mg by mouth at bedtime.     [provider]  methocarbamol (ROBAXIN) 500 MG tablet Take 1 tablet (500 mg total) by mouth 2 (two) times daily. Patient not taking: Reported  on 12/30/2018 12/23/18   Maxwell Caul, PA-C  naproxen (NAPROSYN) 375 MG tablet Take 1 tablet (375 mg total) by mouth 2 (two) times daily. Patient not taking: Reported on 12/30/2018 12/30/18   Sadrac Zeoli A, PA-C  predniSONE (DELTASONE) 20 MG tablet 3 tabs po day one, then 2 tabs daily x 4 days Patient not taking: Reported on 12/30/2018 11/07/18   Fayrene Helper, PA-C  risperiDONE (RISPERDAL) 1 MG tablet Take 1 mg by mouth at bedtime.     [provider]  traZODone (DESYREL) 50 MG tablet Take 50 mg by  mouth at bedtime as needed for sleep.    [provider]    Family History No family history on file.  Social History Social History   Tobacco Use  . Smoking status: Never Smoker  . Smokeless tobacco: Never Used  Substance Use Topics  . Alcohol use: No  . Drug use: Yes    Types: Cocaine    Comment: clean x 1 month     Allergies   Patient has no known allergies.   Review of Systems Review of Systems  Constitutional: Negative.   HENT: Negative.   Respiratory: Negative.   Cardiovascular: Negative.   Gastrointestinal: Negative.   Musculoskeletal:       Bilateral ankle pain as well as left foot pain.  Skin: Negative.   Neurological: Negative.   All other systems reviewed and are negative.    Physical Exam Updated Vital Signs BP 129/73   Pulse 73   Temp 98.3 F (36.8 C) (Oral)   Resp 16   LMP 10/10/2011   SpO2 99%   Physical Exam Vitals signs and nursing note reviewed.  Constitutional:      General: She is not in acute distress.    Appearance: She is well-developed. She is not ill-appearing, toxic-appearing or diaphoretic.  HENT:     Head: Normocephalic and atraumatic.     Nose: Nose normal.     Mouth/Throat:     Mouth: Mucous membranes are moist.     Pharynx: Oropharynx is clear.  Eyes:     Pupils: Pupils are equal, round, and reactive to light.  Neck:     Musculoskeletal: Normal range of motion.  Cardiovascular:     Rate and Rhythm: Normal rate.     Pulses: Normal pulses.     Heart sounds: Normal heart sounds. No murmur. No friction rub. No gallop.   Pulmonary:     Effort: Pulmonary effort is normal. No respiratory distress.     Breath sounds: Normal breath sounds.  Abdominal:     General: There is no distension.     Tenderness: There is no abdominal tenderness. There is no guarding or rebound.  Musculoskeletal: Normal range of motion.     Comments: Full range of motion bilateral lower extremities with plantarflexion, dorsiflexion,  inversion and eversion.  No gross deformities or bony tenderness.  No lower extremity swelling.  Lower extremity compartments are soft.  Skin:    General: Skin is warm and dry.     Comments: No edema, erythema, ecchymosis or warmth to bilateral lower extremities.  Neurological:     Mental Status: She is alert.     Comments: 5/5 strength to bilateral lower extremities.  Able to ambulate without difficulty.      ED Treatments / Results  Labs (all labs ordered are listed, but only abnormal results are displayed) Labs Reviewed - No data to display  EKG None  Radiology Dg Foot  Complete Left  Result Date: 12/30/2018 CLINICAL DATA:  Left plantar pain for 2 days, no injury EXAM: LEFT FOOT - COMPLETE 3+ VIEW COMPARISON:  March 17, 2017 FINDINGS: There is no evidence of fracture or dislocation. There is no evidence of arthropathy or other focal bone abnormality. Soft tissues are unremarkable. IMPRESSION: Negative. Electronically Signed   By: Sherian ReinWei-Chen  Lin M.D.   On: 12/30/2018 10:04    Procedures Procedures (including critical care time)  Medications Ordered in ED Medications - No data to display   Initial Impression / Assessment and Plan / ED Course  I have reviewed the triage vital signs and the nursing notes.  Pertinent labs & imaging results that were available during my care of the patient were reviewed by me and considered in my medical decision making (see chart for details).  53 year old female who present otherwise well presents for evaluation of foot pain.  Afebrile, nonseptic, non-ill-appearing.  Patient with bilateral ankle pain as well as left foot pain in the dorsum of her foot which radiates into toes 3 and 4.  Sensation is described as burning.  States her ankle pain is chronic in nature.  Previous diagnosis of tendinitis.  States she would just like a brace for her ankles as this is helped previously.  Normal musculoskeletal exam.  Neurovascularly intact.  Low suspicion for  septic joint, gout, hemarthrosis, fracture or dislocation.  X-ray negative for fracture or dislocation left foot.  Exam seems to be consistent with a Morton's neuroma.  Discussed anti-inflammatories as follow-up with podiatry or orthopedics for reevaluation.  Patient able to ambulate department without difficulty.  Low suspicion for emergent pathology at this time causing patient's symptoms.  Patient is hemodynamically stable and appropriate for DC home at this time.  I discussed return precautions with patient. Patient voiced understanding and is agreeable for follow-up.    Final Clinical Impressions(s) / ED Diagnoses   Final diagnoses:  Foot pain, left  Chronic pain of both ankles    ED Discharge Orders         Ordered    naproxen (NAPROSYN) 375 MG tablet  2 times daily     12/30/18 1033           Kourtlynn Trevor A, PA-C 12/30/18 1535    Terrilee FilesButler, Michael C, MD 12/30/18 1747

## 2018-12-30 NOTE — ED Notes (Signed)
EDPA Provider at bedside. DISCUSSING DISCHARGE INSTRUCTIONS

## 2019-01-20 ENCOUNTER — Encounter (HOSPITAL_COMMUNITY): Payer: Self-pay | Admitting: Emergency Medicine

## 2019-01-20 ENCOUNTER — Other Ambulatory Visit: Payer: Self-pay

## 2019-01-20 ENCOUNTER — Emergency Department (HOSPITAL_COMMUNITY)
Admission: EM | Admit: 2019-01-20 | Discharge: 2019-01-21 | Disposition: A | Discharge status: Home / Self Care | Payer: Self-pay | Attending: Emergency Medicine | Admitting: Emergency Medicine

## 2019-01-20 DIAGNOSIS — F251 Schizoaffective disorder, depressive type: Secondary | ICD-10-CM | POA: Diagnosis present

## 2019-01-20 DIAGNOSIS — Z79899 Other long term (current) drug therapy: Secondary | ICD-10-CM | POA: Insufficient documentation

## 2019-01-20 DIAGNOSIS — F329 Major depressive disorder, single episode, unspecified: Secondary | ICD-10-CM | POA: Insufficient documentation

## 2019-01-20 DIAGNOSIS — F32A Depression, unspecified: Secondary | ICD-10-CM

## 2019-01-20 DIAGNOSIS — J45909 Unspecified asthma, uncomplicated: Secondary | ICD-10-CM | POA: Insufficient documentation

## 2019-01-20 LAB — CBC
HCT: 38.5 % (ref 36.0–46.0)
Hemoglobin: 12.5 g/dL (ref 12.0–15.0)
MCH: 29.9 pg (ref 26.0–34.0)
MCHC: 32.5 g/dL (ref 30.0–36.0)
MCV: 92.1 fL (ref 80.0–100.0)
Platelets: 223 10*3/uL (ref 150–400)
RBC: 4.18 MIL/uL (ref 3.87–5.11)
RDW: 12.6 % (ref 11.5–15.5)
WBC: 4.1 10*3/uL (ref 4.0–10.5)
nRBC: 0 % (ref 0.0–0.2)

## 2019-01-20 LAB — COMPREHENSIVE METABOLIC PANEL
ALT: 20 U/L (ref 0–44)
AST: 23 U/L (ref 15–41)
Albumin: 4.3 g/dL (ref 3.5–5.0)
Alkaline Phosphatase: 43 U/L (ref 38–126)
Anion gap: 7 (ref 5–15)
BUN: 19 mg/dL (ref 6–20)
CO2: 27 mmol/L (ref 22–32)
Calcium: 9.3 mg/dL (ref 8.9–10.3)
Chloride: 103 mmol/L (ref 98–111)
Creatinine, Ser: 0.78 mg/dL (ref 0.44–1.00)
GFR calc Af Amer: >60 mL/min (ref >60)
GFR calc non Af Amer: >60 mL/min (ref >60)
Glucose, Bld: 94 mg/dL (ref 70–99)
Potassium: 4.5 mmol/L (ref 3.5–5.1)
Sodium: 137 mmol/L (ref 135–145)
Total Bilirubin: 0.5 mg/dL (ref 0.3–1.2)
Total Protein: 7.8 g/dL (ref 6.5–8.1)

## 2019-01-20 LAB — ETHANOL: Alcohol, Ethyl (B): <10 mg/dL (ref <10)

## 2019-01-20 LAB — RAPID URINE DRUG SCREEN, HOSP PERFORMED
Amphetamines: NOT DETECTED
Barbiturates: NOT DETECTED
Benzodiazepines: NOT DETECTED
Cocaine: NOT DETECTED
Opiates: NOT DETECTED
Tetrahydrocannabinol: NOT DETECTED

## 2019-01-20 LAB — ACETAMINOPHEN LEVEL: Acetaminophen (Tylenol), Serum: <10 ug/mL — ABNORMAL LOW (ref 10–30)

## 2019-01-20 LAB — SALICYLATE LEVEL: Salicylate Lvl: <7 mg/dL (ref 2.8–30.0)

## 2019-01-20 MED ORDER — RISPERIDONE 1 MG PO TABS
1.0000 mg | ORAL_TABLET | Freq: Every day | ORAL | Status: DC
  Administered 2019-01-20: 1 mg via ORAL
  Filled 2019-01-20: qty 1

## 2019-01-20 MED ORDER — MOMETASONE FURO-FORMOTEROL FUM 100-5 MCG/ACT IN AERO
2.0000 | INHALATION_SPRAY | Freq: Two times a day (BID) | RESPIRATORY_TRACT | Status: DC
  Administered 2019-01-20 – 2019-01-21 (×2): 2 via RESPIRATORY_TRACT
  Filled 2019-01-20: qty 8.8

## 2019-01-20 MED ORDER — TRAZODONE HCL 50 MG PO TABS
50.0000 mg | ORAL_TABLET | Freq: Every evening | ORAL | Status: DC | PRN
  Administered 2019-01-20: 50 mg via ORAL
  Filled 2019-01-20: qty 1

## 2019-01-20 MED ORDER — ALBUTEROL SULFATE HFA 108 (90 BASE) MCG/ACT IN AERS: 1.0000 | INHALATION_SPRAY | Freq: Four times a day (QID) | RESPIRATORY_TRACT | Status: DC | PRN

## 2019-01-20 MED ORDER — GABAPENTIN 300 MG PO CAPS
300.0000 mg | ORAL_CAPSULE | Freq: Every day | ORAL | Status: DC
  Administered 2019-01-20: 300 mg via ORAL
  Filled 2019-01-20: qty 1

## 2019-01-20 MED ORDER — HYDROMORPHONE HCL 1 MG/ML IJ SOLN: 0.7500 mg | Freq: Once | INTRAMUSCULAR | Status: DC

## 2019-01-20 NOTE — ED Provider Notes (Signed)
[facility]-EMERGENCY DEPT Provider Note   CSN: [id number] Arrival date & time: 01/20/19  1122     History   Chief Complaint Chief Complaint  Patient presents with  . Suicidal    HPI Amanda Keith is a 53 y.o. female.  HPI   53 year old female with depression and passive SI.  Patient states that she is homeless.  Her significant other has had ongoing health issues and sounds like a recent diagnosis of cancer.  Because of this he has lost his job.  They are unable to afford there residence anymore.  He is currently hospitalized and she is out on the street.  She occasionally has thoughts of "just not being around anymore."  She has no specific plan.  She has been off her medications since October.  She cites financial barriers and also just no interest in taking them.  Past Medical History:  Diagnosis Date  . Abuse, drug or alcohol (HCC)   . Anxiety   . Asthma   . Schizo affective schizophrenia (HCC)   . Scoliosis     Patient Active Problem List   Diagnosis Date Noted  . Schizoaffective disorder, depressive type (HCC) 12/21/2016  . Cocaine abuse (HCC) 12/21/2016    Past Surgical History:  Procedure Laterality Date  . ABDOMINAL HYSTERECTOMY    . CESAREAN SECTION       OB History   No obstetric history on file.      Home Medications    Prior to Admission medications   Medication Sig Start Date End Date Taking? Authorizing Provider  albuterol (PROVENTIL HFA;VENTOLIN HFA) 108 (90 Base) MCG/ACT inhaler Inhale 1-2 puffs into the lungs every 6 (six) hours as needed for wheezing or shortness of breath. 03/07/17  Yes Muthersbaugh, Dahlia Client, PA-C  Atorvastatin Calcium (LIPITOR PO) Take 1 tablet by mouth every evening.   Yes [provider]  budesonide-formoterol (SYMBICORT) 80-4.5 MCG/ACT inhaler Inhale 2 puffs into the lungs 2 (two) times daily as needed (for asthma flares). 03/07/17  Yes Muthersbaugh, Dahlia Client, PA-C  gabapentin (NEURONTIN) 300 MG  capsule Take 300 mg by mouth at bedtime.    Yes [provider]  predniSONE (DELTASONE) 20 MG tablet 3 tabs po day one, then 2 tabs daily x 4 days 11/07/18  Yes Fayrene Helper, PA-C  risperiDONE (RISPERDAL) 1 MG tablet Take 1 mg by mouth at bedtime.    Yes [provider]  traZODone (DESYREL) 50 MG tablet Take 50 mg by mouth at bedtime as needed for sleep.   Yes [provider]  cyclobenzaprine (FLEXERIL) 10 MG tablet Take 1 tablet (10 mg total) by mouth 2 (two) times daily as needed for muscle spasms. Patient not taking: Reported on 12/30/2018 11/07/18   Fayrene Helper, PA-C  furosemide (LASIX) 20 MG tablet Take one tablet daily as needed for leg swelling. Patient not taking: Reported on 12/30/2018 03/19/17   Molpus, Jonny Ruiz, MD  methocarbamol (ROBAXIN) 500 MG tablet Take 1 tablet (500 mg total) by mouth 2 (two) times daily. Patient not taking: Reported on 12/30/2018 12/23/18   Graciella Freer A, PA-C  naproxen (NAPROSYN) 375 MG tablet Take 1 tablet (375 mg total) by mouth 2 (two) times daily. Patient not taking: Reported on 12/30/2018 12/30/18   Henderly, Britni A, PA-C    Family History No family history on file.  Social History Social History   Tobacco Use  . Smoking status: Never Smoker  . Smokeless tobacco: Never Used  Substance Use Topics  . Alcohol  use: No  . Drug use: Yes    Types: Cocaine    Comment: clean x 1 month     Allergies   Patient has no known allergies.   Review of Systems Review of Systems  All systems reviewed and negative, other than as noted in HPI.  Physical Exam Updated Vital Signs BP 98/70 (BP Location: Right Arm)   Pulse (!) 101   Temp 98 F (36.7 C) (Oral)   Resp 18   LMP 10/10/2011   SpO2 100%   Physical Exam Vitals signs and nursing note reviewed.  Constitutional:      General: She is not in acute distress.    Appearance: She is well-developed.  HENT:     Head: Normocephalic and atraumatic.  Eyes:     General:         Right eye: No discharge.        Left eye: No discharge.     Conjunctiva/sclera: Conjunctivae normal.  Neck:     Musculoskeletal: Neck supple.  Cardiovascular:     Rate and Rhythm: Normal rate and regular rhythm.     Heart sounds: Normal heart sounds. No murmur. No friction rub. No gallop.   Pulmonary:     Effort: Pulmonary effort is normal. No respiratory distress.     Breath sounds: Normal breath sounds.  Abdominal:     General: There is no distension.     Palpations: Abdomen is soft.     Tenderness: There is no abdominal tenderness.  Musculoskeletal:        General: No tenderness.  Skin:    General: Skin is warm and dry.  Neurological:     Mental Status: She is alert and oriented to person, place, and time.     Cranial Nerves: No cranial nerve deficit.     Sensory: No sensory deficit.     Motor: No weakness.  Psychiatric:        Behavior: Behavior normal.        Thought Content: Thought content normal.      ED Treatments / Results  Labs (all labs ordered are listed, but only abnormal results are displayed) Labs Reviewed  ACETAMINOPHEN LEVEL - Abnormal; Notable for the following components:      Result Value   Acetaminophen (Tylenol), Serum <10 (*)    All other components within normal limits  COMPREHENSIVE METABOLIC PANEL  ETHANOL  SALICYLATE LEVEL  CBC  RAPID URINE DRUG SCREEN, HOSP PERFORMED    EKG None  Radiology No results found.  Procedures Procedures (including critical care time)  Medications Ordered in ED Medications - No data to display   Initial Impression / Assessment and Plan / ED Course  I have reviewed the triage vital signs and the nursing notes.  Pertinent labs & imaging results that were available during my care of the patient were reviewed by me and considered in my medical decision making (see chart for details).    53 year old female with increasing depression and passive suicidal ideation.  Financial stressors, declining health  of her significant other and homeless.  She does not seem psychotic.  She is medically cleared for TTS evaluation.  Final Clinical Impressions(s) / ED Diagnoses   Final diagnoses:  Depression, unspecified depression type    ED Discharge Orders    None       Raeford Razor, MD 01/20/19 [id number]
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
## 2019-01-20 NOTE — ED Notes (Signed)
Bed: [facility] Expected date:  Expected time:  Means of arrival:  Comments: 

## 2019-01-20 NOTE — ED Notes (Signed)
Pt oriented to room and unit.  Pt is pleasant and cooperative.  Pt contracts for safety.  15 minute checks and video monitoring in place. 

## 2019-01-20 NOTE — ED Notes (Signed)
Sitter at the bedside.

## 2019-01-20 NOTE — ED Notes (Signed)
Reports depressed related to being homeless, fiance is in the cancer center and is terminal and she has not income and needs healthcare herself. She has not been on her antidepressants since Oct since she has no money and is unaware of any where she can go for free.  She has intermittent suicidal thoughts but no plan and no intention. She does think she needs to be inpatient for awhile to get back on meds and get some resources to help her be independent. They lost there housing when her fiance became to sick to work and she is from [place] and unaware of resources for her to help with living and her meds.

## 2019-01-20 NOTE — ED Notes (Signed)
Bed: WBH35 Expected date:  Expected time:  Means of arrival:  Comments: Hold for triage 3 

## 2019-01-20 NOTE — ED Triage Notes (Signed)
Patient c/o depression and SI without plan for "a little while." Not taking psych meds since October. Reports significant other is ill, increasing depression. Homeless.

## 2019-01-21 DIAGNOSIS — F251 Schizoaffective disorder, depressive type: Secondary | ICD-10-CM

## 2019-01-21 MED ORDER — IBUPROFEN 800 MG PO TABS
800.0000 mg | ORAL_TABLET | Freq: Once | ORAL | Status: AC
  Administered 2019-01-21: 800 mg via ORAL
  Filled 2019-01-21: qty 1

## 2019-01-21 NOTE — Patient Outreach (Signed)
ED Peer Support Specialist Patient Intake (Complete at intake & 30-60 Day Follow-up)  Name: Amanda Keith  MRN: [id number]  Age: 53 y.o.   Date of Admission: 01/21/2019  Intake: Initial Comments:      Primary Reason Admitted: Depression, unspecified depression type   Lab values: Alcohol/ETOH: Negative Positive UDS? No Amphetamines: No Barbiturates: No Benzodiazepines: No Cocaine: No Opiates: No Cannabinoids: No  Demographic information: Gender: Female Ethnicity: African American Marital Status: Divorced Insurance Status: Uninsured/Self-pay Ecologist (Work Neurosurgeon, Physicist, medical, etc.: No Lives with: Alone Living situation: House/Apartment  Reported Patient History: Patient reported health conditions: Schizoaffective disorder, Anxiety disorders, Depression Patient aware of HIV and hepatitis status: No  In past year, has patient visited ED for any reason? No  Number of ED visits:    Reason(s) for visit:    In past year, has patient been hospitalized for any reason? No  Number of hospitalizations:    Reason(s) for hospitalization:    In past year, has patient been arrested? No  Number of arrests:    Reason(s) for arrest:    In past year, has patient been incarcerated? No  Number of incarcerations:    Reason(s) for incarceration:    In past year, has patient received medication-assisted treatment? No  In past year, patient received the following treatments: Non-residential community treatment  In past year, has patient received any harm reduction services? No  Did this include any of the following?    In past year, has patient received care from a mental health provider for diagnosis other than SUD? No  In past year, is this first time patient has overdosed? No  Number of past overdoses:    In past year, is this first time patient has been hospitalized for an overdose? No  Number of hospitalizations for overdose(s):     Is patient currently receiving treatment for a mental health diagnosis? No  Patient reports experiencing difficulty participating in SUD treatment: No    Most important reason(s) for this difficulty?    Has patient received prior services for treatment? No  In past, patient has received services from following agencies:    Plan of Care:  Suggested follow up at these agencies/treatment centers: (Attend family services and wants )  Other information: CPSS met with Pt and was able to process with Pt to better assist Pt at this time. CPSS was made aware that she has been seeking help because of her being homeless with nowhere to go. CPSS made several calls to various places to seek placement. CPSS made Pt aware about the 7 to 7 over night stay.    Aaron Edelman Jamaury Gumz, CPSS  01/21/2019 1:05 PM

## 2019-01-21 NOTE — Consult Note (Signed)
[facility]  01/21/2019 11:33 AM Amanda BlazerSandra Keith  MRN:  [id number] Principal Problem: Schizoaffective disorder, depressive type [facility](HCC) Discharge Diagnoses: Principal Problem:   Schizoaffective disorder, depressive type (HCC)   Subjective: Pt was seen and chart reviewed with treatment team and Dr Lucianne MussKumar.  Pt denies suicidal/homicidal ideation, denies auditory/visual hallucinations and does not appear to be responding to internal stimuli. [facility] due to her homeless situation which was causing her to have some passive suicidal thoughts. Her significant other is in the hospital with cancer and as a result they have lost their housing. She stated she has been to several shelters in the area and that she has used up all the days she can and needs to find a new shelter. She spoke about going [place] [facility] [place] and is also interested in the [employer] [employer] rescue [facility]. She does have a past history of cocaine abuse but has not used since August. She is calm and cooperative, alert and oriented x 4. She makes good eye contact and speaks in a low tone with decreased volume. She has no intent or plan to harm herself and denies she has ever attempted to harm herself. Pt will be seen by Peer Support for assistance with substance abuse treatment in the community and to be given information about [employer] [facility]. Pt is psychiatrically clear.   Total Time spent with patient: 30 minutes  Past Psychiatric History: As above  Past Medical History:  Past Medical History:  Diagnosis Date  . Abuse, drug or alcohol (HCC)   . Anxiety   . Asthma   . Schizo affective schizophrenia (HCC)   . Scoliosis     Past Surgical History:  Procedure Laterality Date  . ABDOMINAL HYSTERECTOMY    . CESAREAN SECTION     Family History: No family history on file. Family Psychiatric  History: Pt did not give this information Social History:  Social History   Substance and Sexual Activity   Alcohol Use No     Social History   Substance and Sexual Activity  Drug Use Yes  . Types: Cocaine   Comment: clean x 1 month    Social History   Socioeconomic History  . Marital status: Single    Spouse name: Not on file  . Number of children: Not on file  . Years of education: Not on file  . Highest education level: Not on file  Occupational History  . Not on file  Social Needs  . Financial resource strain: Not on file  . Food insecurity:    Worry: Not on file    Inability: Not on file  . Transportation needs:    Medical: Not on file    Non-medical: Not on file  Tobacco Use  . Smoking status: Never Smoker  . Smokeless tobacco: Never Used  Substance and Sexual Activity  . Alcohol use: No  . Drug use: Yes    Types: Cocaine    Comment: clean x 1 month  . Sexual activity: Not on file    Comment: crack  Lifestyle  . Physical activity:    Days per week: Not on file    Minutes per session: Not on file  . Stress: Not on file  Relationships  . Social connections:    Talks on phone: Not on file    Gets together: Not on file    Attends religious service: Not on file    Active member of club  or organization: Not on file    Attends meetings of clubs or organizations: Not on file    Relationship status: Not on file  Other Topics Concern  . Not on file  Social History Narrative  . Not on file    Has this patient used any form of tobacco in the last 30 days? (Cigarettes, Smokeless Tobacco, Cigars, and/or Pipes) Prescription not provided because: Pt does not smoke tobacco  Current Medications: Current Facility-Administered Medications  Medication Dose Route Frequency Provider Last Rate Last Dose  . albuterol (PROVENTIL HFA;VENTOLIN HFA) 108 (90 Base) MCG/ACT inhaler 1-2 puff  1-2 puff Inhalation Q6H PRN Raeford Razor, MD      . gabapentin (NEURONTIN) capsule 300 mg  300 mg Oral QHS Raeford Razor, MD   300 mg at 01/20/19 2157  . mometasone-formoterol (DULERA) 100-5  MCG/ACT inhaler 2 puff  2 puff Inhalation BID Raeford Razor, MD   2 puff at 01/21/19 0815  . risperiDONE (RISPERDAL) tablet 1 mg  1 mg Oral QHS Raeford Razor, MD   1 mg at 01/20/19 2157  . traZODone (DESYREL) tablet 50 mg  50 mg Oral QHS PRN Raeford Razor, MD   50 mg at 01/20/19 2200   Current Outpatient Medications  Medication Sig Dispense Refill  . albuterol (PROVENTIL HFA;VENTOLIN HFA) 108 (90 Base) MCG/ACT inhaler Inhale 1-2 puffs into the lungs every 6 (six) hours as needed for wheezing or shortness of breath. 18 g 0  . Atorvastatin Calcium (LIPITOR PO) Take 1 tablet by mouth every evening.    . budesonide-formoterol (SYMBICORT) 80-4.5 MCG/ACT inhaler Inhale 2 puffs into the lungs 2 (two) times daily as needed (for asthma flares). 1 Inhaler 0  . gabapentin (NEURONTIN) 300 MG capsule Take 300 mg by mouth at bedtime.     . predniSONE (DELTASONE) 20 MG tablet 3 tabs po day one, then 2 tabs daily x 4 days 11 tablet 0  . risperiDONE (RISPERDAL) 1 MG tablet Take 1 mg by mouth at bedtime.     . traZODone (DESYREL) 50 MG tablet Take 50 mg by mouth at bedtime as needed for sleep.    . cyclobenzaprine (FLEXERIL) 10 MG tablet Take 1 tablet (10 mg total) by mouth 2 (two) times daily as needed for muscle spasms. (Patient not taking: Reported on 12/30/2018) 20 tablet 0  . furosemide (LASIX) 20 MG tablet Take one tablet daily as needed for leg swelling. (Patient not taking: Reported on 12/30/2018) 10 tablet 0  . methocarbamol (ROBAXIN) 500 MG tablet Take 1 tablet (500 mg total) by mouth 2 (two) times daily. (Patient not taking: Reported on 12/30/2018) 10 tablet 0  . naproxen (NAPROSYN) 375 MG tablet Take 1 tablet (375 mg total) by mouth 2 (two) times daily. (Patient not taking: Reported on 12/30/2018) 20 tablet 0     Musculoskeletal: Strength & Muscle Tone: within normal limits Gait & Station: normal Patient leans: N/A  Psychiatric Specialty Exam: Physical Exam  ROS  Blood pressure 102/68, pulse 81,  temperature 97.9 F (36.6 C), temperature source Oral, resp. rate 16, last menstrual period 10/10/2011, SpO2 100 %.There is no height or weight on file to calculate BMI.  General Appearance: Casual  Eye Contact:  Good  Speech:  Clear and Coherent  Volume:  Decreased  Mood:  Depressed  Affect:  Congruent and Depressed  Thought Process:  Coherent, Goal Directed, Linear and Descriptions of Associations: Intact  Orientation:  Full (Time, Place, and Person)  Thought Content:  Logical  Suicidal Thoughts:  No  Homicidal Thoughts:  No  Memory:  Immediate;   Good Recent;   Good Remote;   Fair  Judgement:  Fair  Insight:  Fair  Psychomotor Activity:  Normal  Concentration:  Concentration: Good and Attention Span: Good  Recall:  Good  Fund of Knowledge:  Good  Language:  Good  Akathisia:  No  Handed:  Right  AIMS (if indicated):     Assets:  Communication Skills Desire for Improvement Physical Health Social Support  ADL's:  Intact  Cognition:  WNL  Sleep:        Demographic Factors:  Low socioeconomic status, Living alone and Unemployed  Loss Factors: Financial problems/change in socioeconomic status  Historical Factors: Family history of mental illness or substance abuse  Risk Reduction Factors:   Sense of responsibility to family  Continued Clinical Symptoms:  Bipolar Disorder:   Depressive phase  Cognitive Features That Contribute To Risk:  Closed-mindedness    Suicide Risk:  Minimal: No identifiable suicidal ideation.  Patients presenting with no risk factors but with morbid ruminations; may be classified as minimal risk based on the severity of the depressive symptoms    Plan Of Care/Follow-up recommendations:  Activity:  as tolerated Diet:  heart healthy  Disposition and Treatment Plan: Schizoaffective disorder, depressive type (HCC)  Take all medications as prescribed. Keep all follow-up appointments as scheduled.  Do not consume alcohol or use illegal  drugs while on prescription medications. Report any adverse effects from your medications to your primary care provider promptly.  In the event of recurrent symptoms or worsening symptoms, call 911, a crisis hotline, or go to the nearest emergency department for evaluation.   Laveda Abbe, NP 01/21/2019, 11:33 AM

## 2019-01-21 NOTE — BH Assessment (Addendum)
[facility] Assessment Progress Note  Per Nelly Rout, MD, this pt does not require psychiatric hospitalization at this time.  Pt is to be discharged from [facility].  Discharge instructions include information regarding area supportive services for the homeless.  Pt would also benefit from seeing Peer Support Specialists; they will be asked to speak to pt.  [name] nurse, Kendal Hymen, has been notified.  Doylene Canning, MA Triage Specialist [phone]

## 2019-01-21 NOTE — BHH Counselor (Signed)
Pt has referred to the following facilities:   [facility]. [facility]. [facility].  *TTS to continues to seek placement.*  Redmond Pulling, MS, [facility], [facility] Triage Specialist [phone]

## 2019-01-21 NOTE — Discharge Instructions (Signed)
For your shelter needs, contact the following service providers:       [facility] (operated by [facility])      [address] [place], [place] [address]      [phone]       [facility]      [address]      [place], [place] [address]      [phone]  For day shelter and other supportive services for the homeless, contact the [facility]):       [facility]      [address]      [place], [place] [address]      [phone]  For transitional housing, contact one of the following agencies.  They provide longer term housing than a shelter, but there is an application process:       [facility] of [employer] [facility] [place]      [address], [place] [address]      [phone]

## 2019-05-27 ENCOUNTER — Encounter (HOSPITAL_COMMUNITY): Payer: Self-pay

## 2019-05-27 ENCOUNTER — Other Ambulatory Visit: Payer: Self-pay

## 2019-05-27 ENCOUNTER — Emergency Department (HOSPITAL_COMMUNITY)
Admission: EM | Admit: 2019-05-27 | Discharge: 2019-05-28 | Disposition: A | Discharge status: Other Acute Inpatient Hospital | Payer: Self-pay | Attending: Emergency Medicine | Admitting: Emergency Medicine

## 2019-05-27 DIAGNOSIS — F1410 Cocaine abuse, uncomplicated: Secondary | ICD-10-CM | POA: Insufficient documentation

## 2019-05-27 DIAGNOSIS — J45909 Unspecified asthma, uncomplicated: Secondary | ICD-10-CM | POA: Insufficient documentation

## 2019-05-27 DIAGNOSIS — Z79899 Other long term (current) drug therapy: Secondary | ICD-10-CM | POA: Insufficient documentation

## 2019-05-27 DIAGNOSIS — R443 Hallucinations, unspecified: Secondary | ICD-10-CM | POA: Insufficient documentation

## 2019-05-27 DIAGNOSIS — Z20828 Contact with and (suspected) exposure to other viral communicable diseases: Secondary | ICD-10-CM | POA: Insufficient documentation

## 2019-05-27 DIAGNOSIS — R45851 Suicidal ideations: Secondary | ICD-10-CM | POA: Insufficient documentation

## 2019-05-27 LAB — RAPID URINE DRUG SCREEN, HOSP PERFORMED
Amphetamines: NOT DETECTED
Barbiturates: NOT DETECTED
Benzodiazepines: NOT DETECTED
Cocaine: POSITIVE — AB
Opiates: NOT DETECTED
Tetrahydrocannabinol: NOT DETECTED

## 2019-05-27 LAB — COMPREHENSIVE METABOLIC PANEL
ALT: 18 U/L (ref 0–44)
AST: 24 U/L (ref 15–41)
Albumin: 4.5 g/dL (ref 3.5–5.0)
Alkaline Phosphatase: 59 U/L (ref 38–126)
Anion gap: 11 (ref 5–15)
BUN: 20 mg/dL (ref 6–20)
CO2: 26 mmol/L (ref 22–32)
Calcium: 9.2 mg/dL (ref 8.9–10.3)
Chloride: 103 mmol/L (ref 98–111)
Creatinine, Ser: 1.13 mg/dL — ABNORMAL HIGH (ref 0.44–1.00)
GFR calc Af Amer: >60 mL/min (ref >60)
GFR calc non Af Amer: 55 mL/min — ABNORMAL LOW (ref >60)
Glucose, Bld: 96 mg/dL (ref 70–99)
Potassium: 3.3 mmol/L — ABNORMAL LOW (ref 3.5–5.1)
Sodium: 140 mmol/L (ref 135–145)
Total Bilirubin: 0.2 mg/dL — ABNORMAL LOW (ref 0.3–1.2)
Total Protein: 8.3 g/dL — ABNORMAL HIGH (ref 6.5–8.1)

## 2019-05-27 LAB — CBC
HCT: 44.8 % (ref 36.0–46.0)
Hemoglobin: 14.3 g/dL (ref 12.0–15.0)
MCH: 29.9 pg (ref 26.0–34.0)
MCHC: 31.9 g/dL (ref 30.0–36.0)
MCV: 93.5 fL (ref 80.0–100.0)
Platelets: 239 10*3/uL (ref 150–400)
RBC: 4.79 MIL/uL (ref 3.87–5.11)
RDW: 12.5 % (ref 11.5–15.5)
WBC: 5.3 10*3/uL (ref 4.0–10.5)
nRBC: 0 % (ref 0.0–0.2)

## 2019-05-27 LAB — ETHANOL: Alcohol, Ethyl (B): <10 mg/dL (ref <10)

## 2019-05-27 LAB — SALICYLATE LEVEL: Salicylate Lvl: <7 mg/dL (ref 2.8–30.0)

## 2019-05-27 LAB — I-STAT BETA HCG BLOOD, ED (MC, WL, AP ONLY): I-stat hCG, quantitative: <5 m[IU]/mL (ref <5)

## 2019-05-27 LAB — ACETAMINOPHEN LEVEL: Acetaminophen (Tylenol), Serum: <10 ug/mL — ABNORMAL LOW (ref 10–30)

## 2019-05-27 NOTE — ED Triage Notes (Signed)
Pt reports that she is feeling suicidal. States that she was in rehab, but left because her fiance passed away. Reports that she has been trying to stay high to avoid dealing with his death. She uses crack cocaine and last used this morning. Tearful in triage.
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
## 2019-05-27 NOTE — ED Notes (Signed)
Bed: WTR9 Expected date:  Expected time:  Means of arrival:  Comments: 

## 2019-05-28 ENCOUNTER — Other Ambulatory Visit: Payer: Self-pay | Admitting: Behavioral Health

## 2019-05-28 ENCOUNTER — Encounter (HOSPITAL_COMMUNITY): Payer: Self-pay | Admitting: Registered Nurse

## 2019-05-28 ENCOUNTER — Inpatient Hospital Stay (HOSPITAL_COMMUNITY)
Admission: AD | Admit: 2019-05-28 | Discharge: 2019-06-10 | DRG: 885 | Disposition: A | Discharge status: Home / Self Care | Payer: Self-pay | Source: Intra-hospital | Attending: Psychiatry | Admitting: Psychiatry

## 2019-05-28 ENCOUNTER — Other Ambulatory Visit: Payer: Self-pay

## 2019-05-28 ENCOUNTER — Encounter (HOSPITAL_COMMUNITY): Payer: Self-pay

## 2019-05-28 DIAGNOSIS — M5430 Sciatica, unspecified side: Secondary | ICD-10-CM | POA: Diagnosis present

## 2019-05-28 DIAGNOSIS — G8929 Other chronic pain: Secondary | ICD-10-CM | POA: Diagnosis present

## 2019-05-28 DIAGNOSIS — F1410 Cocaine abuse, uncomplicated: Secondary | ICD-10-CM | POA: Diagnosis present

## 2019-05-28 DIAGNOSIS — J45909 Unspecified asthma, uncomplicated: Secondary | ICD-10-CM | POA: Diagnosis present

## 2019-05-28 DIAGNOSIS — G4700 Insomnia, unspecified: Secondary | ICD-10-CM | POA: Diagnosis present

## 2019-05-28 DIAGNOSIS — Z818 Family history of other mental and behavioral disorders: Secondary | ICD-10-CM

## 2019-05-28 DIAGNOSIS — Z915 Personal history of self-harm: Secondary | ICD-10-CM

## 2019-05-28 DIAGNOSIS — Z590 Homelessness: Secondary | ICD-10-CM

## 2019-05-28 DIAGNOSIS — F329 Major depressive disorder, single episode, unspecified: Secondary | ICD-10-CM | POA: Diagnosis present

## 2019-05-28 DIAGNOSIS — R45851 Suicidal ideations: Secondary | ICD-10-CM | POA: Diagnosis present

## 2019-05-28 DIAGNOSIS — F1424 Cocaine dependence with cocaine-induced mood disorder: Secondary | ICD-10-CM

## 2019-05-28 DIAGNOSIS — F259 Schizoaffective disorder, unspecified: Principal | ICD-10-CM | POA: Diagnosis present

## 2019-05-28 DIAGNOSIS — M419 Scoliosis, unspecified: Secondary | ICD-10-CM | POA: Diagnosis present

## 2019-05-28 DIAGNOSIS — F4310 Post-traumatic stress disorder, unspecified: Secondary | ICD-10-CM | POA: Diagnosis present

## 2019-05-28 DIAGNOSIS — Z634 Disappearance and death of family member: Secondary | ICD-10-CM

## 2019-05-28 LAB — SARS CORONAVIRUS 2 BY RT PCR (HOSPITAL ORDER, PERFORMED IN ~~LOC~~ HOSPITAL LAB): SARS Coronavirus 2: NEGATIVE

## 2019-05-28 MED ORDER — FLUTICASONE FUROATE-VILANTEROL 100-25 MCG/INH IN AEPB
1.0000 | INHALATION_SPRAY | Freq: Every day | RESPIRATORY_TRACT | Status: DC
  Filled 2019-05-28: qty 28

## 2019-05-28 MED ORDER — ENSURE ENLIVE PO LIQD
237.0000 mL | Freq: Two times a day (BID) | ORAL | Status: DC
  Administered 2019-05-29 – 2019-06-09 (×18): 237 mL via ORAL

## 2019-05-28 MED ORDER — MAGNESIUM HYDROXIDE 400 MG/5ML PO SUSP: 30.0000 mL | Freq: Every day | ORAL | Status: DC | PRN

## 2019-05-28 MED ORDER — ALBUTEROL SULFATE HFA 108 (90 BASE) MCG/ACT IN AERS
1.0000 | INHALATION_SPRAY | RESPIRATORY_TRACT | Status: DC | PRN
  Administered 2019-05-29 – 2019-06-08 (×3): 2 via RESPIRATORY_TRACT

## 2019-05-28 MED ORDER — TRAZODONE HCL 50 MG PO TABS
50.0000 mg | ORAL_TABLET | Freq: Every evening | ORAL | Status: DC | PRN
  Administered 2019-05-28: 50 mg via ORAL
  Filled 2019-05-28 (×5): qty 1

## 2019-05-28 MED ORDER — ACETAMINOPHEN 325 MG PO TABS
650.0000 mg | ORAL_TABLET | Freq: Four times a day (QID) | ORAL | Status: DC | PRN
  Administered 2019-05-28 – 2019-06-06 (×8): 650 mg via ORAL
  Filled 2019-05-28 (×8): qty 2

## 2019-05-28 MED ORDER — TRAZODONE HCL 100 MG PO TABS
100.0000 mg | ORAL_TABLET | Freq: Every day | ORAL | Status: DC
  Administered 2019-05-28: 50 mg via ORAL
  Filled 2019-05-28: qty 1

## 2019-05-28 MED ORDER — ALBUTEROL SULFATE HFA 108 (90 BASE) MCG/ACT IN AERS
1.0000 | INHALATION_SPRAY | Freq: Four times a day (QID) | RESPIRATORY_TRACT | Status: DC | PRN
  Administered 2019-05-28: 2 via RESPIRATORY_TRACT
  Filled 2019-05-28: qty 6.7

## 2019-05-28 MED ORDER — ALUM & MAG HYDROXIDE-SIMETH 200-200-20 MG/5ML PO SUSP
30.0000 mL | ORAL | Status: DC | PRN
  Administered 2019-06-07 (×2): 30 mL via ORAL
  Filled 2019-05-28 (×2): qty 30

## 2019-05-28 NOTE — ED Provider Notes (Signed)
[facility]-EMERGENCY DEPT Provider Note   CSN: [id number] Arrival date & time: 05/27/19  2039    History   Chief Complaint Chief Complaint  Patient presents with  . Suicidal    HPI Amanda Keith is a 53 y.o. female with past medical history of cocaine abuse, schizoaffective disorder, anxiety, presenting to the emergency department voluntarily with complaint of suicidal ideation.  Patient states back in November when she was in drug rehab, her fianc found out he had cancer and was given 6 months to live.  She states 2 weeks later he fell into a diabetic coma and passed away.  She states this has been causing her much grief.  She left rehab due to this and states she has been using drugs frequently since that time to avoid her feelings, and to stay high as much as possible.  She states she is at the point where she feels like she no longer wants to live.  She states she says there is "no point in being here if I am not here."  She endorses smoking crack cocaine, last use early this morning.  No medical complaints.  She has been prescribed psychiatric medications, however not been taking them in some time.  She endorses auditory visual hallucinations that have been worsening and starting to bother her.  She has no specific plan for ending her life.    The history is provided by the patient.    Past Medical History:  Diagnosis Date  . Abuse, drug or alcohol (HCC)   . Anxiety   . Asthma   . Schizo affective schizophrenia (HCC)   . Scoliosis     Patient Active Problem List   Diagnosis Date Noted  . Schizoaffective disorder, depressive type (HCC) 12/21/2016  . Cocaine abuse (HCC) 12/21/2016    Past Surgical History:  Procedure Laterality Date  . ABDOMINAL HYSTERECTOMY    . CESAREAN SECTION       OB History   No obstetric history on file.      Home Medications    Prior to Admission medications   Medication Sig Start Date End Date Taking? Authorizing  Provider  acetaminophen (TYLENOL) 325 MG tablet Take 650 mg by mouth every 6 (six) hours as needed for mild pain or headache.   Yes [provider]  albuterol (PROVENTIL HFA;VENTOLIN HFA) 108 (90 Base) MCG/ACT inhaler Inhale 1-2 puffs into the lungs every 6 (six) hours as needed for wheezing or shortness of breath. Patient not taking: Reported on 05/27/2019 03/07/17   Muthersbaugh, Dahlia ClientHannah, PA-C  budesonide-formoterol (SYMBICORT) 80-4.5 MCG/ACT inhaler Inhale 2 puffs into the lungs 2 (two) times daily as needed (for asthma flares). Patient not taking: Reported on 05/27/2019 03/07/17   Muthersbaugh, Dahlia ClientHannah, PA-C  cyclobenzaprine (FLEXERIL) 10 MG tablet Take 1 tablet (10 mg total) by mouth 2 (two) times daily as needed for muscle spasms. Patient not taking: Reported on 12/30/2018 11/07/18   Fayrene Helperran, Bowie, PA-C  furosemide (LASIX) 20 MG tablet Take one tablet daily as needed for leg swelling. Patient not taking: Reported on 12/30/2018 03/19/17   Molpus, Jonny RuizJohn, MD  methocarbamol (ROBAXIN) 500 MG tablet Take 1 tablet (500 mg total) by mouth 2 (two) times daily. Patient not taking: Reported on 12/30/2018 12/23/18   Graciella FreerLayden, Lindsey A, PA-C  naproxen (NAPROSYN) 375 MG tablet Take 1 tablet (375 mg total) by mouth 2 (two) times daily. Patient not taking: Reported on 12/30/2018 12/30/18   Henderly, Britni A, PA-C  predniSONE (DELTASONE) 20  MG tablet 3 tabs po day one, then 2 tabs daily x 4 days Patient not taking: Reported on 05/27/2019 11/07/18   Fayrene Helperran, Bowie, PA-C    Family History History reviewed. No pertinent family history.  Social History Social History   Tobacco Use  . Smoking status: Never Smoker  . Smokeless tobacco: Never Used  Substance Use Topics  . Alcohol use: No  . Drug use: Yes    Types: Cocaine    Comment: clean x 1 month     Allergies   Patient has no known allergies.   Review of Systems Review of Systems  Psychiatric/Behavioral: Positive for dysphoric mood, hallucinations and  suicidal ideas.  All other systems reviewed and are negative.    Physical Exam Updated Vital Signs BP (!) 150/105 (BP Location: Right Arm)   Pulse 87   Temp 98.6 F (37 C) (Oral)   Resp 16   LMP 10/10/2011   SpO2 100%   Physical Exam Vitals signs and nursing note reviewed.  Constitutional:      Appearance: She is well-developed.  HENT:     Head: Normocephalic and atraumatic.  Eyes:     Conjunctiva/sclera: Conjunctivae normal.  Cardiovascular:     Rate and Rhythm: Normal rate and regular rhythm.  Pulmonary:     Effort: Pulmonary effort is normal. No respiratory distress.     Breath sounds: Normal breath sounds.  Abdominal:     Palpations: Abdomen is soft.  Skin:    General: Skin is warm.  Neurological:     Mental Status: She is alert.  Psychiatric:        Mood and Affect: Affect is tearful.        Speech: Speech normal.        Behavior: Behavior normal. Behavior is cooperative.        Thought Content: Thought content includes suicidal ideation. Thought content does not include homicidal ideation. Thought content does not include suicidal plan.      ED Treatments / Results  Labs (all labs ordered are listed, but only abnormal results are displayed) Labs Reviewed  COMPREHENSIVE METABOLIC PANEL - Abnormal; Notable for the following components:      Result Value   Potassium 3.3 (*)    Creatinine, Ser 1.13 (*)    Total Protein 8.3 (*)    Total Bilirubin 0.2 (*)    GFR calc non Af Amer 55 (*)    All other components within normal limits  ACETAMINOPHEN LEVEL - Abnormal; Notable for the following components:   Acetaminophen (Tylenol), Serum <10 (*)    All other components within normal limits  RAPID URINE DRUG SCREEN, HOSP PERFORMED - Abnormal; Notable for the following components:   Cocaine POSITIVE (*)    All other components within normal limits  SARS CORONAVIRUS 2 (HOSPITAL ORDER, PERFORMED IN  HOSPITAL LAB)  ETHANOL  SALICYLATE LEVEL  CBC   I-STAT BETA HCG BLOOD, ED ([facility], [facility], [facility] ONLY)    EKG None  Radiology No results found.  Procedures Procedures (including critical care time)  Medications Ordered in ED Medications - No data to display   Initial Impression / Assessment and Plan / ED Course  I have reviewed the triage vital signs and the nursing notes.  Pertinent labs & imaging results that were available during my care of the patient were reviewed by me and considered in my medical decision making (see chart for details).       Patient presenting voluntarily with suicidal ideations,  AVH, and drug use after recent death of her fianc.  Patient states she has been coping by staying high on cocaine to drown her feelings.  She is tearful on exam.  No medical complaints.  Screening labs are unremarkable.  TTS to evaluate patient and determine disposition.  Final Clinical Impressions(s) / ED Diagnoses   Final diagnoses:  Suicidal ideation  Hallucinations  Cocaine abuse [facility])    ED Discharge Orders    None       Robinson, Martin[place] N, PA-C 05/28/19 0023    Charlesetta Shanks, MD 05/28/19 1805

## 2019-05-28 NOTE — ED Notes (Signed)
Patient is calm and cooperative with staff. Patient is resting in room. Sitter at bedside.

## 2019-05-28 NOTE — Tx Team (Signed)
Initial Treatment Plan 05/28/2019 9:01 PM Rolla Plate GBM:[id number]    PATIENT STRESSORS: Financial difficulties Loss of fiance Substance abuse Other: homeless   PATIENT STRENGTHS: Average or above average intelligence Capable of independent living Communication skills Motivation for treatment/growth   PATIENT IDENTIFIED PROBLEMS:      "Hopelessness"    "losing fiance"     " medication management"         DISCHARGE CRITERIA:  Adequate post-discharge living arrangements Improved stabilization in mood, thinking, and/or behavior Safe-care adequate arrangements made Verbal commitment to aftercare and medication compliance  PRELIMINARY DISCHARGE PLAN: Outpatient therapy Placement in alternative living arrangements  PATIENT/FAMILY INVOLVEMENT: This treatment plan has been presented to and reviewed with the patient, Amanda Keith,   The patient has been given the opportunity to ask questions and make suggestions.  Waymond Cera, RN 05/28/2019, 9:01 PM

## 2019-05-28 NOTE — BHH Counselor (Signed)
[name] referral has been sent to the following inpatient treatment facilities:   [facility]. [place].  [facility].  [place]. [facility]. [facility].   TTS to continue to seek placement.    Vertell Novak, [place], [facility], [facility] Triage Specialist [phone]

## 2019-05-28 NOTE — Consult Note (Addendum)
  Tele Assessment   Amanda Keith, 53 y.o., female patient presented to [facility] with complaints of worsening depression and suicidal ideation.  Assessment completed via telephone related to tele psychic machine and ipad not working for visual/assessment.  by this provider and Dr. Darleene Cleaver; chart reviewed  on 05/28/19.  On evaluation Amanda Keith reporting that she continues to be suicidal and is unable to contract for safety.  Reporting stressor grief related to the death of her fiance.   Patient also admits cocaine use.     Recommendations:  Inpatient psychiatric treatment  Disposition: Recommend psychiatric Inpatient admission when medically cleared.   Earleen Newport, NP Patient seen face-to-face for psychiatric evaluation, chart reviewed and case discussed with the physician extender and developed treatment plan. Reviewed the information documented and agree with the treatment plan. Corena Pilgrim, MD

## 2019-05-28 NOTE — Progress Notes (Addendum)
Patient is a 53 year old female admitted  from [facility] ED for complaints of worsening depression and suicidal ideation. Pt reports feelings of hopelessness and grief having lost her fiance to pancreatic cancer in April. Pt has a hx of schizophrenia and cocaine abuse and is currently homeless. Pt presents with a depressed affect congruent with mood- tearful at times- was calm, cooperative and answered questions appropriately throughout the admission interview. Pt currently denies SI/HI- endorses A/V hallucinations at times, stating ,"I see shadows" and "I hear people say my name, like they're talking about me". VS obtained. Skin assessment revealed no abnormalities, nor contraband. Belongings secured in locker and search room closet. Admission paperwork signed- Pt oriented to unit. Q 15 min checks initiated for safety.

## 2019-05-28 NOTE — ED Notes (Addendum)
[facility] has been contacted for transport of the patient to [facility].

## 2019-05-28 NOTE — BH Assessment (Signed)
[facility] Assessment Progress Note Patient has been accepted to 405-1 at 1500 hours.

## 2019-05-28 NOTE — ED Notes (Signed)
Patient has signed Voluntary Admission and Consent for Treatment . Form will be given to [facility] upon arrival.

## 2019-05-29 DIAGNOSIS — F1424 Cocaine dependence with cocaine-induced mood disorder: Secondary | ICD-10-CM

## 2019-05-29 DIAGNOSIS — F259 Schizoaffective disorder, unspecified: Principal | ICD-10-CM

## 2019-05-29 LAB — LIPID PANEL
Cholesterol: 188 mg/dL (ref 0–200)
HDL: 51 mg/dL (ref >40)
LDL Cholesterol: 117 mg/dL — ABNORMAL HIGH (ref 0–99)
Total CHOL/HDL Ratio: 3.7 RATIO
Triglycerides: 102 mg/dL (ref <150)
VLDL: 20 mg/dL (ref 0–40)

## 2019-05-29 LAB — HEMOGLOBIN A1C
Hgb A1c MFr Bld: 5.7 % — ABNORMAL HIGH (ref 4.8–5.6)
Mean Plasma Glucose: 116.89 mg/dL

## 2019-05-29 LAB — TSH: TSH: 0.705 u[IU]/mL (ref 0.350–4.500)

## 2019-05-29 MED ORDER — TRAZODONE HCL 50 MG PO TABS
50.0000 mg | ORAL_TABLET | Freq: Every evening | ORAL | Status: DC | PRN
  Administered 2019-05-29 – 2019-06-09 (×11): 50 mg via ORAL
  Filled 2019-05-29 (×10): qty 1

## 2019-05-29 MED ORDER — GABAPENTIN 100 MG PO CAPS
100.0000 mg | ORAL_CAPSULE | Freq: Two times a day (BID) | ORAL | Status: DC
  Administered 2019-05-29 – 2019-05-30 (×3): 100 mg via ORAL
  Filled 2019-05-29 (×8): qty 1

## 2019-05-29 MED ORDER — RISPERIDONE 1 MG PO TABS
1.0000 mg | ORAL_TABLET | Freq: Every day | ORAL | Status: DC
  Administered 2019-05-29 – 2019-06-09 (×12): 1 mg via ORAL
  Filled 2019-05-29 (×14): qty 1

## 2019-05-29 MED ORDER — ADULT MULTIVITAMIN W/MINERALS CH
1.0000 | ORAL_TABLET | Freq: Every day | ORAL | Status: DC
  Administered 2019-05-30 – 2019-06-10 (×12): 1 via ORAL
  Filled 2019-05-29 (×14): qty 1

## 2019-05-29 MED ORDER — SERTRALINE HCL 25 MG PO TABS
25.0000 mg | ORAL_TABLET | Freq: Every day | ORAL | Status: DC
  Administered 2019-05-29 – 2019-05-30 (×2): 25 mg via ORAL
  Filled 2019-05-29 (×4): qty 1

## 2019-05-29 NOTE — Progress Notes (Signed)
NUTRITION ASSESSMENT  Pt identified as at risk on the Malnutrition Screen Tool  INTERVENTION: 1. Agree with regular diet. 2. Continue Ensure Enlive po BID, each supplement provides 350 kcal and 20 grams of protein. 3. Provide daily MVI.  NUTRITION DIAGNOSIS: Unintentional weight loss related to sub-optimal intake as evidenced by pt report.   Goal: Pt to meet >/= 90% of their estimated nutrition needs.  Monitor:  PO intake  Assessment:  53 y.o. female with PMHx of asthma, anxiety, schizoaffective schizophrenia, drug abuse admitted with schizoaffective disorder, cocaine use disorder, bereavement.  Patient presented voluntarily with worsening depression. Recent loss of fiance in April. She reported on H&P decreased appetite, loss of energy/fatigue, and weight loss of about 10 lbs. No meal completion documented so unable to tell how patient is eating here.  Per chart patient was 56.2 kg on 11/07/2018 and loss down to 49.9 kg on 12/18/2018, which is a significant weight loss of 11.2% over approximately 1 month. Missing weight history since January but patient now up to 52.6 kg.  Medications reviewed.  Labs reviewed.  Height: Ht Readings from Last 1 Encounters:  05/28/19 5\' 4"  (1.626 m)   Weight: Wt Readings from Last 1 Encounters:  05/28/19 52.6 kg   Weight Hx: Wt Readings from Last 10 Encounters:  05/28/19 52.6 kg  12/23/18 50 kg  12/18/18 49.9 kg  11/07/18 56.2 kg  03/18/17 55.3 kg  03/07/17 56 kg  02/27/17 59 kg  02/09/17 56.7 kg  01/03/17 56.2 kg  12/20/16 54.4 kg   BMI:  Body mass index is 19.91 kg/m. Pt meets criteria for normal weight based on current BMI.  Estimated Nutritional Needs: Kcal: 25-30 kcal/kg Protein: > 1 gram protein/kg Fluid: 1 ml/kcal  Diet Order:  Diet Order            Diet regular Room service appropriate? No; Fluid consistency: Thin; Fluid restriction: 2000 mL Fluid  Diet effective now             Pt is also offered choice of unit  snacks mid-morning and mid-afternoon.   Willey Blade, MS, [place], LDN Office: [phone] Pager: [phone] After Hours/Weekend Pager: [phone]

## 2019-05-29 NOTE — Progress Notes (Signed)
DAR NOTE: Pt present with flat affect and depressed mood in the unit.  Pt has been isolating most of the evening, but came out fro snacks and medication. EKG done and given to NP on duty. Pt complained of insomnia, trazodone 50 mg with a repeat ordrerd. Pt complained of pain on her left foot due to tendonitis, took  her meds as scheduled.  [name] safety ensured with 15 minute and environmental checks. Pt currently denies SI/HI and A/V hallucinations. Pt verbally agrees to seek staff if SI/HI or A/VH occurs and to consult with staff before acting on these thoughts. Will continue POC.
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
## 2019-05-29 NOTE — [facility] Group Notes (Signed)
[facility] LCSW Group Therapy Note  05/29/2019   10:00-11:00AM  Type of Therapy and Topic:  Group Therapy:  Unhealthy versus Healthy Supports, Which Am I?  Participation Level:  Did Not Attend   Description of Group:  Patients in this group were introduced to the concept that additional supports including self-support are an essential part of recovery.  Initially a discussion was held about the differences between healthy versus unhealthy supports.  Patients were asked to share what unhealthy supports in their lives need to be addressed, as well as what additional healthy supports could be added for greater help in reaching their goals.   A song entitled "My Own Hero" was played and a group discussion ensued in which patients stated they could relate to the song and it inspired them to realize they have be willing to help themselves in order to succeed, because other people cannot achieve sobriety or stability for them.  We discussed adding a variety of healthy supports to address the various needs in patient lives, including becoming more self-supportive.  A song was played called "I Know Where I've Been" toward the end of group and used to conduct an inspirational wrap-up to group of remembering how far they have already come in their journey.  Therapeutic Goals: 1)  Highlight the differences between healthy and unhealthy supports 2)  Suggest the importance of being a part of one's own support system 2)  Discuss reasons people in one's life may eventually be unable to be continually supportive  3)  Identify the patient's current support system   4) Elicit commitments to add healthy supports and to become more conscious of being self-supportive   Summary of Patient Progress:  Did not attend  Therapeutic Modalities:   Motivational Interviewing Activity  Sarahanne Novakowski J Grossman-Orr , MSW, LCSW  

## 2019-05-29 NOTE — Progress Notes (Signed)
Pt did not attend wrap-up group   

## 2019-05-29 NOTE — Progress Notes (Signed)
D   Pt argumentative and asking for things that were locked in her locker that she was told she couldn't have   Pt also complained of not being able to have her inhaler with her   A   Verbal support  given   Medications administered and effectiveness monitored    Q 15 min checks  Explained to pt why she couldn't have certain items  R    Pt safe at this time  [place] CORONAVIRUS (COVID-19) DAILY CHECK-OFF SYMPTOMS - answer yes or no to each - every day NO YES  Have you had a fever in the past 24 hours?  . Fever (Temp > 37.80C / 100F) X   Have you had any of these symptoms in the past 24 hours? . New Cough .  Sore Throat  .  Shortness of Breath .  Difficulty Breathing .  Unexplained Body Aches   X   Have you had any one of these symptoms in the past 24 hours not related to allergies?   . Runny Nose .  Nasal Congestion .  Sneezing   X   If you have had runny nose, nasal congestion, sneezing in the past 24 hours, has it worsened?  X   EXPOSURES - check yes or no X   Have you traveled outside the state in the past 14 days?  X   Have you been in contact with someone with a confirmed diagnosis of COVID-19 or PUI in the past 14 days without wearing appropriate PPE?  X   Have you been living in the same home as a person with confirmed diagnosis of COVID-19 or a PUI (household contact)?    X   Have you been diagnosed with COVID-19?    X              What to do next: Answered NO to all: Answered YES to anything:   Proceed with unit schedule Follow the [facility] Inpatient Flowsheet.

## 2019-05-29 NOTE — Progress Notes (Signed)
Patient ID: Amanda Keith, female   DOB: 25-Jul-1966, 53 y.o.   MRN: [id number]  D: Pt alert and oriented on the unit. Pt in bed resting when approached. Pt denies SI/HI, A/VH. Pt isolative in her room and has not attended group or participated during unit activities. Pt is pleasant and cooperative during assessment. A: Education, support and encouragement provided, q15 minute safety checks remain in effect.  R: Pt denies any concerns at this time, and verbally contracts for safety. Pt remains safe on and off the unit.

## 2019-05-30 LAB — BASIC METABOLIC PANEL
Anion gap: 8 (ref 5–15)
BUN: 12 mg/dL (ref 6–20)
CO2: 25 mmol/L (ref 22–32)
Calcium: 9 mg/dL (ref 8.9–10.3)
Chloride: 108 mmol/L (ref 98–111)
Creatinine, Ser: 0.99 mg/dL (ref 0.44–1.00)
GFR calc Af Amer: >60 mL/min (ref >60)
GFR calc non Af Amer: >60 mL/min (ref >60)
Glucose, Bld: 96 mg/dL (ref 70–99)
Potassium: 4.2 mmol/L (ref 3.5–5.1)
Sodium: 141 mmol/L (ref 135–145)

## 2019-05-30 LAB — HEMOGLOBIN A1C
Hgb A1c MFr Bld: 5.7 % — ABNORMAL HIGH (ref 4.8–5.6)
Mean Plasma Glucose: 116.89 mg/dL

## 2019-05-30 LAB — LIPID PANEL
Cholesterol: 180 mg/dL (ref 0–200)
HDL: 46 mg/dL (ref >40)
LDL Cholesterol: 112 mg/dL — ABNORMAL HIGH (ref 0–99)
Total CHOL/HDL Ratio: 3.9 RATIO
Triglycerides: 110 mg/dL (ref <150)
VLDL: 22 mg/dL (ref 0–40)

## 2019-05-30 LAB — PROLACTIN: Prolactin: 27.1 ng/mL — ABNORMAL HIGH (ref 4.8–23.3)

## 2019-05-30 LAB — TSH: TSH: 1.008 u[IU]/mL (ref 0.350–4.500)

## 2019-05-30 MED ORDER — GABAPENTIN 100 MG PO CAPS
100.0000 mg | ORAL_CAPSULE | Freq: Three times a day (TID) | ORAL | Status: DC
  Administered 2019-05-31 (×2): 100 mg via ORAL
  Filled 2019-05-30 (×7): qty 1

## 2019-05-30 MED ORDER — SERTRALINE HCL 50 MG PO TABS
50.0000 mg | ORAL_TABLET | Freq: Every day | ORAL | Status: DC
  Administered 2019-05-31 – 2019-06-02 (×3): 50 mg via ORAL
  Filled 2019-05-30 (×4): qty 1

## 2019-05-30 NOTE — Progress Notes (Signed)
   05/30/19 2104  COVID-19 Daily Checkoff  Have you had a fever (temp > 37.80C/100F)  in the past 24 hours?  No  COVID-19 EXPOSURE  Have you traveled outside the state in the past 14 days? No  Have you been in contact with someone with a confirmed diagnosis of COVID-19 or PUI in the past 14 days without wearing appropriate PPE? No  Have you been living in the same home as a person with confirmed diagnosis of COVID-19 or a PUI (household contact)? No  Have you been diagnosed with COVID-19? No

## 2019-05-30 NOTE — Tx Team (Signed)
Interdisciplinary Treatment and Diagnostic Plan Update  05/30/2019 Time of Session: 09:36am Amanda Keith MRN: [id number]  Principal Diagnosis: <principal problem not specified>  Secondary Diagnoses: Active Problems:   Schizoaffective disorder (HCC)   Current Medications:  Current Facility-Administered Medications  Medication Dose Route Frequency Provider Last Rate Last Dose  . acetaminophen (TYLENOL) tablet 650 mg  650 mg Oral Q6H PRN Mordecai Maes, NP   650 mg at 05/29/19 1613  . albuterol (VENTOLIN HFA) 108 (90 Base) MCG/ACT inhaler 1-2 puff  1-2 puff Inhalation Q4H PRN Derrill Center, NP   2 puff at 05/29/19 1950  . alum & mag hydroxide-simeth (MAALOX/MYLANTA) 200-200-20 MG/5ML suspension 30 mL  30 mL Oral Q4H PRN Mordecai Maes, NP      . feeding supplement (ENSURE ENLIVE) (ENSURE ENLIVE) liquid 237 mL  237 mL Oral BID BM Derrill Center, NP   237 mL at 05/29/19 1614  . gabapentin (NEURONTIN) capsule 100 mg  100 mg Oral BID Cobos, Myer Peer, MD   100 mg at 05/30/19 0729  . magnesium hydroxide (MILK OF MAGNESIA) suspension 30 mL  30 mL Oral Daily PRN Mordecai Maes, NP      . multivitamin with minerals tablet 1 tablet  1 tablet Oral Daily Cobos, Myer Peer, MD   1 tablet at 05/30/19 0729  . risperiDONE (RISPERDAL) tablet 1 mg  1 mg Oral QHS Cobos, Myer Peer, MD   1 mg at 05/29/19 2317  . sertraline (ZOLOFT) tablet 25 mg  25 mg Oral Daily Cobos, Myer Peer, MD   25 mg at 05/30/19 0729  . traZODone (DESYREL) tablet 50 mg  50 mg Oral QHS PRN Cobos, Myer Peer, MD   50 mg at 05/29/19 2317   PTA Medications: Medications Prior to Admission  Medication Sig Dispense Refill Last Dose  . acetaminophen (TYLENOL) 325 MG tablet Take 650 mg by mouth every 6 (six) hours as needed for mild pain or headache.     . albuterol (PROVENTIL HFA;VENTOLIN HFA) 108 (90 Base) MCG/ACT inhaler Inhale 1-2 puffs into the lungs every 6 (six) hours as needed for wheezing or shortness of breath. (Patient  not taking: Reported on 05/27/2019) 18 g 0   . budesonide-formoterol (SYMBICORT) 80-4.5 MCG/ACT inhaler Inhale 2 puffs into the lungs 2 (two) times daily as needed (for asthma flares). (Patient not taking: Reported on 05/27/2019) 1 Inhaler 0   . cyclobenzaprine (FLEXERIL) 10 MG tablet Take 1 tablet (10 mg total) by mouth 2 (two) times daily as needed for muscle spasms. (Patient not taking: Reported on 12/30/2018) 20 tablet 0   . furosemide (LASIX) 20 MG tablet Take one tablet daily as needed for leg swelling. (Patient not taking: Reported on 12/30/2018) 10 tablet 0   . methocarbamol (ROBAXIN) 500 MG tablet Take 1 tablet (500 mg total) by mouth 2 (two) times daily. (Patient not taking: Reported on 12/30/2018) 10 tablet 0   . naproxen (NAPROSYN) 375 MG tablet Take 1 tablet (375 mg total) by mouth 2 (two) times daily. (Patient not taking: Reported on 12/30/2018) 20 tablet 0   . 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   Physician Treatment Plan for Primary Diagnosis: <principal problem not specified> Long Term Goal(s): Improvement in symptoms so as ready for discharge Improvement in symptoms so as ready for discharge   Short Term Goals: Ability to identify changes in lifestyle to reduce recurrence of condition will improve Ability to verbalize feelings will improve Ability to disclose and discuss suicidal ideas Ability to demonstrate self-control will improve Ability to identify and develop effective coping behaviors will  improve Ability to maintain clinical measurements within normal limits will improve Ability to identify changes in lifestyle to reduce recurrence of condition will improve Ability to identify triggers associated with substance abuse/mental health issues will improve  Medication Management: Evaluate patient's response, side effects, and tolerance of medication regimen.  Therapeutic Interventions: 1 to 1 sessions, Unit Group sessions and Medication administration.  Evaluation of Outcomes: Not Met  Physician Treatment Plan for Secondary Diagnosis: Active Problems:   Schizoaffective disorder ([place])  Long Term Goal(s): Improvement in symptoms so as ready for discharge Improvement in symptoms so as ready for discharge   Short Term Goals: Ability to identify changes in lifestyle to reduce recurrence of condition will improve Ability to verbalize feelings will improve Ability to disclose and discuss suicidal ideas Ability to demonstrate self-control will improve Ability to identify and develop effective coping behaviors will improve Ability to maintain clinical measurements within normal limits will improve Ability to identify changes in lifestyle to reduce recurrence of condition will improve Ability to identify triggers associated with substance abuse/mental health issues will improve     Medication Management: Evaluate patient's response, side effects, and tolerance of medication regimen.  Therapeutic Interventions: 1 to 1 sessions, Unit Group sessions and Medication administration.  Evaluation of Outcomes: Not Met   RN Treatment Plan for Primary Diagnosis: <principal problem not specified> Long Term Goal(s): Knowledge of disease and therapeutic regimen to maintain health will improve  Short Term Goals: Ability to participate in decision making will improve, Ability to verbalize feelings will improve, Ability to disclose and discuss suicidal ideas, Ability to identify and develop  effective coping behaviors will improve and Compliance with prescribed medications will improve  Medication Management: RN will administer medications as ordered by provider, will assess and evaluate patient's response and provide education to patient for prescribed medication. RN will report any adverse and/or side effects to prescribing provider.  Therapeutic Interventions: 1 on 1 counseling sessions, Psychoeducation, Medication administration, Evaluate responses to treatment, Monitor vital signs and CBGs as ordered, Perform/monitor CIWA, COWS, AIMS and Fall Risk screenings as ordered, Perform wound care treatments as ordered.  Evaluation of Outcomes: Not Met   LCSW Treatment Plan for Primary Diagnosis: <principal problem not specified> Long Term Goal(s): Safe transition to appropriate next level of care at discharge, Engage patient in therapeutic group addressing interpersonal concerns.  Short Term Goals: Engage patient in aftercare planning with referrals and resources and Increase skills for wellness and recovery  Therapeutic Interventions: Assess for all discharge needs, 1 to 1 time with Social worker, Explore available resources and support systems, Assess for adequacy in community support network, Educate family and significant other(s) on suicide prevention, Complete Psychosocial Assessment, Interpersonal group therapy.  Evaluation of Outcomes: Not Met   Progress in Treatment: Attending groups: No. Participating in groups: No. Taking medication as prescribed: Yes. Toleration medication: Yes. Family/Significant other contact made: No, will contact:  pt's deceased fiance's sister Patient understands diagnosis: Yes. Discussing patient identified problems/goals with staff: Yes. Medical problems stabilized or  resolved: Yes. Denies suicidal/homicidal ideation: Yes. Issues/concerns per patient self-inventory: No. Other:   New problem(s) identified: No, Describe:  None  New Short  Term/Long Term Goal(s): Medication stabilization, elimination of SI thoughts, and development of a comprehensive mental wellness plan.   Patient Goals:  "Get back on my meds and work on my emotional stuff"  Discharge Plan or Barriers: CSW will continue to follow up for appropriate referrals and possible discharge planning  Reason for Continuation of Hospitalization: Anxiety Depression Medication stabilization  Estimated Length of Stay: 3-5 days  Attendees: Patient: 05/30/2019   Physician: Dr. Neita Garnet, MD 05/30/2019   Nursing: Benjamine Mola, [place] 05/30/2019   RN Care Manager: 05/30/2019   Social Worker: Ardelle Anton, LCSW 05/30/2019   Recreational Therapist:  05/30/2019   Other:  05/30/2019   Other:  05/30/2019   Other: 05/30/2019     Scribe for Treatment Team: Trecia Rogers, LCSW 05/30/2019 11:21 AM

## 2019-05-30 NOTE — Evaluation (Addendum)
Physical Therapy Evaluation Patient Details Name: Amanda Keith MRN: [id number] DOB: 09-Sep-1966 Today's Date: 05/30/2019   History of Present Illness  53 yo female admitted with schizoaffective d/o. Hx of substance abuse, asthma, anxiety, SI, scoliosis.  Clinical Impression  On eval, pt was Mod Ind with mobility. She walked ~75 feet with a RW. Pt rated pain 8/10 in low back (pt reports it as sciatica) and bil ankles. Per pt, she has chronic sciatica and tendonitis in her ankles. She wears ankle supports at baseline however she has not been able to wear them in [facility] for safety reasons. She also stated she uses a cane for ambulation at baseline. She is also unable to use a cane in [facility] for safety reasons. Pt stated she has been getting steroid injections for her sciatica, and she feels this would help with her pain. Encouraged her to discuss this with [facility] MD. She stated she has a slip-on ankle orthotic in her personal belongings that doesn't have strings. She feels this will help with her ankle pain, especially the R ankle. Encouraged her to ask staff if they can retrieve it for her to wear if it is allowed. Pt is requesting a back brace and cane for use when she leaves [facility]. Encouraged her to speak with CM/SW to see if these items can be issued to/provided for her.     Follow Up Recommendations No PT follow up    Equipment Recommendations  Cane;Other (back brace - pt requesting both cane and back brace)   Recommendations for Other Services       Precautions / Restrictions Precautions Precautions: Fall Restrictions Weight Bearing Restrictions: No      Mobility  Bed Mobility Overal bed mobility: Modified Independent                Transfers Overall transfer level: Modified independent Equipment used: Rolling walker (2 wheeled)                Ambulation/Gait Ambulation/Gait assistance: Modified independent (Device/Increase time) Gait Distance (Feet): 75 Feet Assistive  device: Rolling walker (2 wheeled)          Stairs            Wheelchair Mobility    Modified Rankin (Stroke Patients Only)       Balance Overall balance assessment: Mild deficits observed, not formally tested                                           Pertinent Vitals/Pain Pain Assessment: 0-10 Pain Score: 8  Pain Location: bil ankles, L side of back (sciatica per pt) Pain Descriptors / Indicators: Discomfort Pain Intervention(s): Limited activity within patient's tolerance    Home Living Family/patient expects to be discharged to:: Unsure                      Prior Function Level of Independence: Independent with assistive device(s)         Comments: using cane for ambulation     Hand Dominance        Extremity/Trunk Assessment   Upper Extremity Assessment Upper Extremity Assessment: Overall WFL for tasks assessed    Lower Extremity Assessment Lower Extremity Assessment: Generalized weakness    Cervical / Trunk Assessment Cervical / Trunk Assessment: Normal  Communication   Communication: No difficulties  Cognition Arousal/Alertness: Awake/Keith Behavior During Therapy: [facility]  for tasks assessed/performed Overall Cognitive Status: Within Functional Limits for tasks assessed                                        General Comments      Exercises     Assessment/Plan    PT Assessment Patent does not need any further PT services  PT Problem List         PT Treatment Interventions      PT Goals (Current goals can be found in the Care Plan section)  Acute Rehab PT Goals Patient Stated Goal: to go to inpatient rehab PT Goal Formulation: All assessment and education complete, DC therapy    Frequency     Barriers to discharge        Co-evaluation               AM-PAC PT "6 Clicks" Mobility  Outcome Measure Help needed turning from your back to your side while in a flat bed without  using bedrails?: None Help needed moving from lying on your back to sitting on the side of a flat bed without using bedrails?: None Help needed moving to and from a bed to a chair (including a wheelchair)?: None Help needed standing up from a chair using your arms (e.g., wheelchair or bedside chair)?: None   Help needed climbing 3-5 steps with a railing? : A Little 6 Click Score: 19    End of Session Equipment Utilized During Treatment: Gait belt Activity Tolerance: Patient tolerated treatment well Patient left: in bed;with call bell/phone within reach        Time: 1440-1500 PT Time Calculation (min) (ACUTE ONLY): 20 min   Charges:   PT Evaluation $PT Eval Moderate Complexity: 1 Mod            Amanda AlertJannie Siniyah Keith, PT Acute Rehabilitation Services Pager: [phone] Office: [phone]

## 2019-05-30 NOTE — Care Management (Signed)
CMA attempted to contact [facility]. CMA left a voicemail for Intake Coordinator, Genoveva Ill regarding if patient can return for treatment.   CMA sent referral to [facility].  CMA will follow up with Admission Coordinator, Shayla.   CMA will notify LCSWA, Stephanie Acre.      Chesnie Capell Care Management Assistant  Email:[email or web address] Office: [phone]
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
## 2019-05-30 NOTE — Progress Notes (Signed)
DAR NOTE: Patient presents with anxious affect and depressed mood.  Denies suicidal thoughts, auditory and visual hallucinations.  Rates depression at 7, hopelessness at 6, and anxiety at 7.  Maintained on routine safety checks.  Medications given as prescribed.  Support and encouragement offered as needed.  Attended group and participated.  States goal for today is "being placed."  Patient remain in her room for most of this shift.  Ambulating on the unit with walker.  Patient is safe on and off the unit.

## 2019-05-30 NOTE — Progress Notes (Addendum)
[facility] MD Progress Note  05/30/2019 6:40 PM Amanda Keith  MRN:  [id number] Subjective: Patient states "I am all right".  Currently denies suicidal ideations.  Does not endorse medication side effects.  Objective: I have discussed case with treatment team and have met with patient 53 year old single female, currently homeless, presented voluntarily to ED reporting worsening depression, neuro-vegetative symptoms, demeaning/hypercritical intermittent auditory hallucinations, passive SI, following the death of her fiance three months ago. She has a history of Schizoaffective Disorder diagnosis. She also reports history of Cocaine Use Disorder and relapsed on Cocaine several weeks ago. She reports history of good response to combination of Zoloft, Neurontin, Risperidone , Trazodone in the past, has been off medications for several weeks.  Patient describes being "all right" but acknowledges depression.  She presents depressed, sad, with soft speech, denies suicidal ideations at this time.  At this time does not endorse auditory hallucinations and does not appear internally preoccupied.  Limited milieu participation, polite on approach.  Currently denies medication side effects.  Labs reviewed-TSH 1.008, hemoglobin A1c 5.7.  BMP/lipid panel unremarkable.   Principal Problem: Schizoaffective disorder by history, cocaine use disorder. Diagnosis: Active Problems:   Schizoaffective disorder ([place])  Total Time spent with patient: 15 minutes  Past Psychiatric History:  Past Medical History:  Past Medical History:  Diagnosis Date  . Abuse, drug or alcohol ([place])   . Anxiety   . Asthma   . Schizo affective schizophrenia ([place])   . Scoliosis     Past Surgical History:  Procedure Laterality Date  . ABDOMINAL HYSTERECTOMY    . CESAREAN SECTION     Family History: History reviewed. No pertinent family history. Family Psychiatric  History:  Social History:  Social History   Substance and Sexual  Activity  Alcohol Use No     Social History   Substance and Sexual Activity  Drug Use Yes  . Types: Cocaine   Comment: clean x 1 month    Social History   Socioeconomic History  . Marital status: Single    Spouse name: Not on file  . Number of children: Not on file  . Years of education: Not on file  . Highest education level: Not on file  Occupational History  . Not on file  Social Needs  . Financial resource strain: Not on file  . Food insecurity    Worry: Not on file    Inability: Not on file  . Transportation needs    Medical: Not on file    Non-medical: Not on file  Tobacco Use  . Smoking status: Never Smoker  . Smokeless tobacco: Never Used  Substance and Sexual Activity  . Alcohol use: No  . Drug use: Yes    Types: Cocaine    Comment: clean x 1 month  . Sexual activity: Not on file    Comment: crack  Lifestyle  . Physical activity    Days per week: Not on file    Minutes per session: Not on file  . Stress: Not on file  Relationships  . Social Herbalist on phone: Not on file    Gets together: Not on file    Attends religious service: Not on file    Active member of club or organization: Not on file    Attends meetings of clubs or organizations: Not on file    Relationship status: Not on file  Other Topics Concern  . Not on file  Social History Narrative  .  Not on file   Additional Social History:   Sleep: Good  Appetite:  Fair  Current Medications: Current Facility-Administered Medications  Medication Dose Route Frequency Provider Last Rate Last Dose  . acetaminophen (TYLENOL) tablet 650 mg  650 mg Oral Q6H PRN Mordecai Maes, NP   650 mg at 05/30/19 1805  . albuterol (VENTOLIN HFA) 108 (90 Base) MCG/ACT inhaler 1-2 puff  1-2 puff Inhalation Q4H PRN Derrill Center, NP   2 puff at 05/29/19 1950  . alum & mag hydroxide-simeth (MAALOX/MYLANTA) 200-200-20 MG/5ML suspension 30 mL  30 mL Oral Q4H PRN Mordecai Maes, NP      . feeding  supplement (ENSURE ENLIVE) (ENSURE ENLIVE) liquid 237 mL  237 mL Oral BID BM Derrill Center, NP   237 mL at 05/30/19 1733  . gabapentin (NEURONTIN) capsule 100 mg  100 mg Oral BID Kynlie Jane, Myer Peer, MD   100 mg at 05/30/19 1804  . magnesium hydroxide (MILK OF MAGNESIA) suspension 30 mL  30 mL Oral Daily PRN Mordecai Maes, NP      . multivitamin with minerals tablet 1 tablet  1 tablet Oral Daily Ilayda Toda, Myer Peer, MD   1 tablet at 05/30/19 0729  . risperiDONE (RISPERDAL) tablet 1 mg  1 mg Oral QHS Evan Osburn, Myer Peer, MD   1 mg at 05/29/19 2317  . sertraline (ZOLOFT) tablet 25 mg  25 mg Oral Daily Lejla Moeser, Myer Peer, MD   25 mg at 05/30/19 0729  . traZODone (DESYREL) tablet 50 mg  50 mg Oral QHS PRN Ellamarie Naeve, Myer Peer, MD   50 mg at 05/29/19 2317    Lab Results:  Results for orders placed or performed during the hospital encounter of 05/28/19 (from the past 48 hour(s))  Hemoglobin A1c     Status: Abnormal   Collection Time: 05/29/19  7:48 AM  Result Value Ref Range   Hgb A1c MFr Bld 5.7 (H) 4.8 - 5.6 %    Comment: (NOTE) Pre diabetes:          5.7%-6.4% Diabetes:              >6.4% Glycemic control for   <7.0% adults with diabetes    Mean Plasma Glucose 116.89 mg/dL    Comment: Performed at [facility], [place] [address] [place], [place] [address]  Lipid panel     Status: Abnormal   Collection Time: 05/29/19  7:48 AM  Result Value Ref Range   Cholesterol 188 0 - 200 mg/dL   Triglycerides 102 <150 mg/dL   HDL 51 >40 mg/dL   Total CHOL/HDL Ratio 3.7 RATIO   VLDL 20 0 - 40 mg/dL   LDL Cholesterol 117 (H) 0 - 99 mg/dL    Comment:        Total Cholesterol/HDL:CHD Risk Coronary Heart Disease Risk Table                     Men   Women  1/2 Average Risk   3.4   3.3  Average Risk       5.0   4.4  2 X Average Risk   9.6   7.1  3 X Average Risk  23.4   11.0        Use the calculated Patient Ratio above and the CHD Risk Table to determine the patient's CHD Risk.        ATP III  CLASSIFICATION (LDL):  <100     mg/dL  Optimal  100-129  mg/dL   Near or Above                    Optimal  130-159  mg/dL   Borderline  160-189  mg/dL   High  >190     mg/dL   Very High Performed at [facility] [address] [place], [place] [address]   Prolactin     Status: Abnormal   Collection Time: 05/29/19  7:48 AM  Result Value Ref Range   Prolactin 27.1 (H) 4.8 - 23.3 ng/mL    Comment: (NOTE) Performed At: [facility] [place], [place] [id number] Rush Farmer MD KP:[id number]   TSH     Status: None   Collection Time: 05/29/19  7:48 AM  Result Value Ref Range   TSH 0.705 0.350 - 4.500 uIU/mL    Comment: Performed by a 3rd Generation assay with a functional sensitivity of <=0.01 uIU/mL. Performed at [facility], [place] [address], [place], [place] [address]   TSH     Status: None   Collection Time: 05/30/19  6:38 AM  Result Value Ref Range   TSH 1.008 0.350 - 4.500 uIU/mL    Comment: Performed by a 3rd Generation assay with a functional sensitivity of <=0.01 uIU/mL. Performed at [facility], [place] [address], [place], [place] [address]   Hemoglobin A1c     Status: Abnormal   Collection Time: 05/30/19  6:39 AM  Result Value Ref Range   Hgb A1c MFr Bld 5.7 (H) 4.8 - 5.6 %    Comment: (NOTE) Pre diabetes:          5.7%-6.4% Diabetes:              >6.4% Glycemic control for   <7.0% adults with diabetes    Mean Plasma Glucose 116.89 mg/dL    Comment: Performed at [facility] [address], [place], [place] [address]  Lipid panel     Status: Abnormal   Collection Time: 05/30/19  6:39 AM  Result Value Ref Range   Cholesterol 180 0 - 200 mg/dL   Triglycerides 110 <150 mg/dL   HDL 46 >40 mg/dL   Total CHOL/HDL Ratio 3.9 RATIO   VLDL 22 0 - 40 mg/dL   LDL Cholesterol 112 (H) 0 - 99 mg/dL    Comment:        Total Cholesterol/HDL:CHD Risk Coronary Heart Disease Risk Table                      Men   Women  1/2 Average Risk   3.4   3.3  Average Risk       5.0   4.4  2 X Average Risk   9.6   7.1  3 X Average Risk  23.4   11.0        Use the calculated Patient Ratio above and the CHD Risk Table to determine the patient's CHD Risk.        ATP III CLASSIFICATION (LDL):  <100     mg/dL   Optimal  100-129  mg/dL   Near or Above                    Optimal  130-159  mg/dL   Borderline  160-189  mg/dL   High  >190     mg/dL   Very High Performed at [facility],  [place] [address] [place], [place] [address]   Basic metabolic panel     Status: None   Collection Time: 05/30/19  6:39 AM  Result Value Ref Range   Sodium 141 135 - 145 mmol/L   Potassium 4.2 3.5 - 5.1 mmol/L   Chloride 108 98 - 111 mmol/L   CO2 25 22 - 32 mmol/L   Glucose, Bld 96 70 - 99 mg/dL   BUN 12 6 - 20 mg/dL   Creatinine, Ser 0.99 0.44 - 1.00 mg/dL   Calcium 9.0 8.9 - 10.3 mg/dL   GFR calc non Af Amer >60 >60 mL/min   GFR calc Af Amer >60 >60 mL/min   Anion gap 8 5 - 15    Comment: Performed at [facility], [place] [address], [place], [place] [address]    Blood Alcohol level:  Lab Results  Component Value Date   [facility] <10 05/27/2019   ETH <10 44/31/5400    Metabolic Disorder Labs: Lab Results  Component Value Date   HGBA1C 5.7 (H) 05/30/2019   MPG 116.89 05/30/2019   MPG 116.89 05/29/2019   Lab Results  Component Value Date   PROLACTIN 27.1 (H) 05/29/2019   Lab Results  Component Value Date   CHOL 180 05/30/2019   TRIG 110 05/30/2019   HDL 46 05/30/2019   CHOLHDL 3.9 05/30/2019   VLDL 22 05/30/2019   LDLCALC 112 (H) 05/30/2019   LDLCALC 117 (H) 05/29/2019    Physical Findings: AIMS: Facial and Oral Movements Muscles of Facial Expression: None, normal Lips and Perioral Area: None, normal Jaw: None, normal Tongue: None, normal,Extremity Movements Upper (arms, wrists, hands, fingers): None, normal Lower (legs, knees, ankles, toes):  None, normal, Trunk Movements Neck, shoulders, hips: None, normal, Overall Severity Severity of abnormal movements (highest score from questions above): None, normal Incapacitation due to abnormal movements: None, normal Patient's awareness of abnormal movements (rate only patient's report): No Awareness, Dental Status Current problems with teeth and/or dentures?: No Does patient usually wear dentures?: No  CIWA:    COWS:     Musculoskeletal: Strength & Muscle Tone: within normal limits Gait & Station: normal -ambulating with walker Patient leans: N/A  Psychiatric Specialty Exam: Physical Exam  ROS denies chest pain or shortness of breath, no cough, no nausea, no vomiting.  Blood pressure 100/80, pulse 60, temperature 97.6 F (36.4 C), temperature source Oral, resp. rate 18, height '5\' 4"'  (1.626 m), weight 52.6 kg, last menstrual period 10/10/2011, SpO2 (!) 79 %.Body mass index is 19.91 kg/m.  General Appearance: Fairly Groomed  Eye Contact:  Fair  Speech:  Slow  Volume:  Decreased  Mood:  Depressed  Affect:  constricted  Thought Process:  Linear and Descriptions of Associations: Intact  Orientation:  Other:  fully alert and attentive  Thought Content:  currently denies hallucinations, does not appear internally preoccupied   Suicidal Thoughts:  No at this time denies suicidal or self injurious ideations, contracts for safety on unit, denies homicidal or violent ideations  Homicidal Thoughts:  No  Memory:  recent and remote grossly intact   Judgement:  Fair  Insight:  Fair  Psychomotor Activity:  Decreased (+) involuntary movements noted , mainly on toes/feet   Concentration:  Concentration: Fair and Attention Span: Fair  Recall:  Good  Fund of Knowledge:  Good  Language:  Good  Akathisia:  Negative  Handed:  Right  AIMS (if indicated):     Assets:  resilience  ADL's:  Intact  Cognition:  WNL  Sleep:  Number of Hours: 6.25    Assessment -  53 year old single female,  currently homeless, presented voluntarily to ED reporting worsening depression, neuro-vegetative symptoms, demeaning/hypercritical intermittent auditory hallucinations, passive SI, following the death of her fiance three months ago. She has a history of Schizoaffective Disorder diagnosis. She also reports history of Cocaine Use Disorder and relapsed on Cocaine several weeks ago. She reports history of good response to combination of Zoloft, Neurontin, Risperidone , Trazodone in the past, has been off medications for several weeks.  Today patient presents depressed, constricted in affect, with soft speech, isolative to her room. She states she is feeling somewhat better than on admission.  Today denies suicidal ideations, denies auditory hallucinations and does not appear internally preoccupied.  Does not endorse medication side effects.  As noted, has abnormal involuntary movements noted mostly on toes although also slightly on oral buccal area, suggestive of TD.   Treatment Plan Summary: Daily contact with patient to assess and evaluate symptoms and progress in treatment, Medication management, Plan inpatient treatment  and medications as below Encourage group and milieu participation to work on coping skills and symptom reduction Encourage efforts to work on sobriety/relapse prevention Increase Neurontin to 100 mg 3 times daily for anxiety Continue Risperidone 1 mg q. at bedtime for psychotic symptoms/mood disorder Increase Zoloft to 50 mg QHS for depression( prefers taking medication at night time) Continue Trazodone 50 mg nightly PRN for insomnia Have reviewed potential management with valbenazine or similar with patient.  She is concerned about possible cost related to this medication.  Treatment team working on disposition options.   Jenne Campus, MD 05/30/2019, 6:40 PM

## 2019-05-31 MED ORDER — MELOXICAM 7.5 MG PO TABS
7.5000 mg | ORAL_TABLET | Freq: Every day | ORAL | Status: DC
  Administered 2019-05-31 – 2019-06-03 (×4): 7.5 mg via ORAL
  Filled 2019-05-31 (×5): qty 1

## 2019-05-31 MED ORDER — TRIAMCINOLONE ACETONIDE 40 MG/ML IJ SUSP
40.0000 mg | Freq: Once | INTRAMUSCULAR | Status: AC
  Administered 2019-05-31: 40 mg via INTRAMUSCULAR
  Filled 2019-05-31: qty 1

## 2019-05-31 MED ORDER — GABAPENTIN 100 MG PO CAPS
200.0000 mg | ORAL_CAPSULE | Freq: Three times a day (TID) | ORAL | Status: DC
  Administered 2019-05-31 – 2019-06-03 (×8): 200 mg via ORAL
  Filled 2019-05-31 (×12): qty 2

## 2019-05-31 NOTE — Progress Notes (Signed)
[facility]  05/31/2019 2:23 PM Laurine BlazerSandra Weakland  MRN:  [id number] Subjective: Patient is a 53 year old female with a past psychiatric history significant for schizoaffective disorder as well as cocaine use disorder who presented to the emergency department on 05/29/2019 with suicidal ideation.  Objective: Patient is a 53 year old female with the above-stated past psychiatric history who is seen in follow-up.  She stated that she is feeling slightly better, but still having symptoms of depression.  She stated her auditory hallucinations have improved.  She stated that she feels as though the current medications including Neurontin, Risperdal, Zoloft and trazodone are significantly helping.  She admitted that she had been having worsening symptoms since the death of her fianc.  She is currently using a walker.  She was seen by physical therapy yesterday and she rated her pain 8 out of 10 for sciatica as well as tendinitis in her ankles.  She is using a walker because she is unable to use her ankle braces.  The patient previously had been staying at [facility] in the past.  Social work spoke to her about returning to that facility after she had [facility].  Her vital signs are stable, she is afebrile.  Her CIWA was only 1 this a.m.  She slept 6.75 hours last night.  Review of her laboratories revealed all essentially normal except for her drug screen.  Her prolactin was slightly elevated at 27.1.  Principal Problem: <principal problem not specified> Diagnosis: Active Problems:   Schizoaffective disorder (HCC)  Total Time spent with patient: 20 minutes  Past Psychiatric History: See admission H&P  Past Medical History:  Past Medical History:  Diagnosis Date  . Abuse, drug or alcohol (HCC)   . Anxiety   . Asthma   . Schizo affective schizophrenia (HCC)   . Scoliosis     Past Surgical History:  Procedure Laterality Date  . ABDOMINAL HYSTERECTOMY    .  CESAREAN SECTION     Family History: History reviewed. No pertinent family history. Family Psychiatric  History: An H&P Social History:  Social History   Substance and Sexual Activity  Alcohol Use No     Social History   Substance and Sexual Activity  Drug Use Yes  . Types: Cocaine   Comment: clean x 1 month    Social History   Socioeconomic History  . Marital status: Single    Spouse name: Not on file  . Number of children: Not on file  . Years of education: Not on file  . Highest education level: Not on file  Occupational History  . Not on file  Social Needs  . Financial resource strain: Not on file  . Food insecurity    Worry: Not on file    Inability: Not on file  . Transportation needs    Medical: Not on file    Non-medical: Not on file  Tobacco Use  . Smoking status: Never Smoker  . Smokeless tobacco: Never Used  Substance and Sexual Activity  . Alcohol use: No  . Drug use: Yes    Types: Cocaine    Comment: clean x 1 month  . Sexual activity: Not on file    Comment: crack  Lifestyle  . Physical activity    Days per week: Not on file    Minutes per session: Not on file  . Stress: Not on file  Relationships  . Social connections    Talks on phone: Not on file  Gets together: Not on file    Attends religious service: Not on file    Active member of club or organization: Not on file    Attends meetings of clubs or organizations: Not on file    Relationship status: Not on file  Other Topics Concern  . Not on file  Social History Narrative  . Not on file   Additional Social History:                         Sleep: Good  Appetite:  Fair  Current Medications: Current Facility-Administered Medications  Medication Dose Route Frequency Provider Last Rate Last Dose  . acetaminophen (TYLENOL) tablet 650 mg  650 mg Oral Q6H PRN Mordecai Maes, NP   650 mg at 05/31/19 1017  . albuterol (VENTOLIN HFA) 108 (90 Base) MCG/ACT inhaler 1-2 puff   1-2 puff Inhalation Q4H PRN Derrill Center, NP   2 puff at 05/29/19 1950  . alum & mag hydroxide-simeth (MAALOX/MYLANTA) 200-200-20 MG/5ML suspension 30 mL  30 mL Oral Q4H PRN Mordecai Maes, NP      . feeding supplement (ENSURE ENLIVE) (ENSURE ENLIVE) liquid 237 mL  237 mL Oral BID BM Derrill Center, NP   237 mL at 05/31/19 1013  . gabapentin (NEURONTIN) capsule 100 mg  100 mg Oral TID Cobos, Myer Peer, MD   100 mg at 05/31/19 1210  . magnesium hydroxide (MILK OF MAGNESIA) suspension 30 mL  30 mL Oral Daily PRN Mordecai Maes, NP      . multivitamin with minerals tablet 1 tablet  1 tablet Oral Daily Cobos, Myer Peer, MD   1 tablet at 05/31/19 0748  . risperiDONE (RISPERDAL) tablet 1 mg  1 mg Oral QHS Cobos, Myer Peer, MD   1 mg at 05/30/19 2104  . sertraline (ZOLOFT) tablet 50 mg  50 mg Oral QHS Cobos, Fernando A, MD      . traZODone (DESYREL) tablet 50 mg  50 mg Oral QHS PRN Cobos, Myer Peer, MD   50 mg at 05/30/19 2104    Lab Results:  Results for orders placed or performed during the hospital encounter of 05/28/19 (from the past 48 hour(s))  TSH     Status: None   Collection Time: 05/30/19  6:38 AM  Result Value Ref Range   TSH 1.008 0.350 - 4.500 uIU/mL    Comment: Performed by a 3rd Generation assay with a functional sensitivity of <=0.01 uIU/mL. Performed at [facility], [place] [address], [place], [place] [address]   Hemoglobin A1c     Status: Abnormal   Collection Time: 05/30/19  6:39 AM  Result Value Ref Range   Hgb A1c MFr Bld 5.7 (H) 4.8 - 5.6 %    Comment: (NOTE) Pre diabetes:          5.7%-6.4% Diabetes:              >6.4% Glycemic control for   <7.0% adults with diabetes    Mean Plasma Glucose 116.89 mg/dL    Comment: Performed at [facility] [address] [place], [place] [address]  Lipid panel     Status: Abnormal   Collection Time: 05/30/19  6:39 AM  Result Value Ref Range   Cholesterol 180 0 - 200 mg/dL   Triglycerides 110  <150 mg/dL   HDL 46 >40 mg/dL   Total CHOL/HDL Ratio 3.9 RATIO   VLDL 22 0 - 40 mg/dL  LDL Cholesterol 112 (H) 0 - 99 mg/dL    Comment:        Total Cholesterol/HDL:CHD Risk Coronary Heart Disease Risk Table                     Men   Women  1/2 Average Risk   3.4   3.3  Average Risk       5.0   4.4  2 X Average Risk   9.6   7.1  3 X Average Risk  23.4   11.0        Use the calculated Patient Ratio above and the CHD Risk Table to determine the patient's CHD Risk.        ATP III CLASSIFICATION (LDL):  <100     mg/dL   Optimal  [id number]  mg/dL   Near or Above                    Optimal  130-159  mg/dL   Borderline  [phone]  mg/dL   High  >272>190     mg/dL   Very High Performed at [facility], [address] [place], [place] [address]   Basic metabolic panel     Status: None   Collection Time: 05/30/19  6:39 AM  Result Value Ref Range   Sodium 141 135 - 145 mmol/L   Potassium 4.2 3.5 - 5.1 mmol/L   Chloride 108 98 - 111 mmol/L   CO2 25 22 - 32 mmol/L   Glucose, Bld 96 70 - 99 mg/dL   BUN 12 6 - 20 mg/dL   Creatinine, Ser 4.030.99 0.44 - 1.00 mg/dL   Calcium 9.0 8.9 - 47.410.3 mg/dL   GFR calc non Af Amer >60 >60 mL/min   GFR calc Af Amer >60 >60 mL/min   Anion gap 8 5 - 15    Comment: Performed at [facility], [address] [place], [place] [address]    Blood Alcohol level:  Lab Results  Component Value Date   [facility] <10 05/27/2019   ETH <10 01/20/2019    Metabolic Disorder Labs: Lab Results  Component Value Date   HGBA1C 5.7 (H) 05/30/2019   MPG 116.89 05/30/2019   MPG 116.89 05/29/2019   Lab Results  Component Value Date   PROLACTIN 27.1 (H) 05/29/2019   Lab Results  Component Value Date   CHOL 180 05/30/2019   TRIG 110 05/30/2019   HDL 46 05/30/2019   CHOLHDL 3.9 05/30/2019   VLDL 22 05/30/2019   LDLCALC 112 (H) 05/30/2019   LDLCALC 117 (H) 05/29/2019    Physical Findings: AIMS: Facial and Oral Movements Muscles of  Facial Expression: None, normal Lips and Perioral Area: None, normal Jaw: None, normal Tongue: None, normal,Extremity Movements Upper (arms, wrists, hands, fingers): None, normal Lower (legs, knees, ankles, toes): None, normal, Trunk Movements Neck, shoulders, hips: None, normal, Overall Severity Severity of abnormal movements (highest score from questions above): None, normal Incapacitation due to abnormal movements: None, normal Patient's awareness of abnormal movements (rate only patient's report): No Awareness, Dental Status Current problems with teeth and/or dentures?: No Does patient usually wear dentures?: No  CIWA:  CIWA-Ar Total: 1 COWS:  COWS Total Score: 2  Musculoskeletal: Strength & Muscle Tone: decreased Gait & Station: unsteady Patient leans: N/A  Psychiatric Specialty Exam: Physical Exam  Nursing note and vitals reviewed. Constitutional: She is oriented to person, place, and time. She appears well-developed and well-nourished.  HENT:  Head: Normocephalic and atraumatic.  Respiratory: Effort normal.  Neurological: She is alert and oriented to person, place, and time.    ROS  Blood pressure 103/82, pulse 78, temperature 97.7 F (36.5 C), temperature source Oral, resp. rate 16, height 5\' 4"  (1.626 m), weight 52.6 kg, last menstrual period 10/10/2011, SpO2 (!) 79 %.Body mass index is 19.91 kg/m.  General Appearance: Casual  Eye Contact:  Fair  Speech:  Normal Rate  Volume:  Decreased  Mood:  Depressed  Affect:  Congruent  Thought Process:  Coherent and Descriptions of Associations: Intact  Orientation:  Full (Time, Place, and Person)  Thought Content:  Hallucinations: Auditory  Suicidal Thoughts:  No  Homicidal Thoughts:  No  Memory:  Immediate;   Fair Recent;   Fair Remote;   Fair  Judgement:  Intact  Insight:  Fair  Psychomotor Activity:  Psychomotor Retardation  Concentration:  Concentration: Fair and Attention Span: Fair  Recall:  [employer]  Fund of  Knowledge:  Fair  Language:  Fair  Akathisia:  Negative  Handed:  Right  AIMS (if indicated):     Assets:  Desire for Improvement Resilience  ADL's:  Intact  Cognition:  WNL  Sleep:  Number of Hours: 6.75     Treatment Plan Summary: Daily contact with patient to assess and evaluate symptoms and progress in treatment, Medication management and Plan : Patient is seen and examined.  Patient is a 53 year old female with the above-stated past psychiatric history who is seen in follow-up.   Diagnosis: #1 schizoaffective disorder; depressive type, #2 cocaine use disorder versus cocaine dependence, #3 sciatic nerve pain, #4 bilateral ankle tendinitis, #5 COPD  Patient is a 53 year old female with the above-stated past psychiatric history seen in follow-up.  She seems to be slowly improving during the course the hospitalization.  No change in her psychiatric medications today.  I am going to increase her gabapentin to 200 mg p.o. 3 times daily for chronic pain issues.  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  I will give her 40 mg of Kenalog IM today, as well as starting her on Mobic 7.5 mg p.o. daily.  We will see if this is beneficial with regard to her pain and ambulation.  Her pulse ox was decreased to a.m. and I will have nursing repeat that just to make sure. 1.  Increase gabapentin to 200 mg p.o. 3 times daily for chronic pain and anxiety. 2.  Continue Risperdal 1 mg p.o. nightly for psychosis. 3.  Continue sertraline 50 mg p.o. nightly for mood and anxiety. 4.  Continue trazodone 50 mg p.o. nightly as needed insomnia. 5.  Continue Ventolin HFA inhalation every 4 hours as needed wheezing. 6.  Kenalog 40 mg IM x1 for tendinitis and osteoarthritic pain. 7.  Meloxicam 7.5 mg p.o. daily for osteoarthritic pain. 8.  Repeat pulse oximetry. 9.  Social work working with patient to gain substance rehabilitation admission. 10.  Disposition planning-in  progress.  Antonieta PertGreg Lawson Alison Breeding, MD 05/31/2019, 2:23 PM

## 2019-05-31 NOTE — Plan of Care (Signed)
D: Patient is in the dayroom, using her walker, alert, and cooperative. Denies SI, HI, AVH, and verbally contracts for safety. Patient interaction is minimal.   A: Medications administered per MD order. Support provided. Patient educated on safety on the unit and medications. Routine safety checks every 15 minutes. Patient stated understanding to tell nurse about any new physical symptoms. Patient understands to tell staff of any needs.     R: No adverse drug reactions noted. Patient verbally contracts for safety. Patient remains safe at this time and will continue to monitor.   Problem: Safety: Goal: Periods of time without injury will increase Outcome: Progressing   Patient remains safe and will continue to monitor.   [place] NOVEL CORONAVIRUS (COVID-19) DAILY CHECK-OFF SYMPTOMS - answer yes or no to each - every day NO YES  Have you had a fever in the past 24 hours?  Fever (Temp > 37.80C / 100F) X   Have you had any of these symptoms in the past 24 hours? New Cough  Sore Throat   Shortness of Breath  Difficulty Breathing  Unexplained Body Aches   X   Have you had any one of these symptoms in the past 24 hours not related to allergies?   Runny Nose  Nasal Congestion  Sneezing   X   If you have had runny nose, nasal congestion, sneezing in the past 24 hours, has it worsened?  X   EXPOSURES - check yes or no X   Have you traveled outside the state in the past 14 days?  X   Have you been in contact with someone with a confirmed diagnosis of COVID-19 or PUI in the past 14 days without wearing appropriate PPE?  X   Have you been living in the same home as a person with confirmed diagnosis of COVID-19 or a PUI (household contact)?    X   Have you been diagnosed with COVID-19?    X              What to do next: Answered NO to all: Answered YES to anything:   Proceed with unit schedule Follow the [facility] Inpatient Flowsheet.

## 2019-05-31 NOTE — Care Management (Signed)
CMA spoke with Intake Coordinator, Genoveva Ill at [facility]. At this time, patients must be an impatient for 14 days before admission to the facility.  [place] notified LCSWA, Marya Amsler.   CMA attempted to contact Admissions Coordinator, [place] at [facility]. CMA will continue to follow up.      Jeffren Dombek Care Management Assistant  Email:[email or web address] Office: [phone]

## 2019-05-31 NOTE — [facility] Suicide Risk Assessment (Signed)
[place] INPATIENT:  Family/Significant Other Suicide Prevention Education  Suicide Prevention Education:  Education Completed; Amanda Keith, friend, [phone] has been identified by the patient as the family member/significant other with whom the patient will be residing, and identified as the person(s) who will aid the patient in the event of a mental health crisis (suicidal ideations/suicide attempt).  With written consent from the patient, the family member/significant other has been provided the following suicide prevention education, prior to the and/or following the discharge of the patient.  The suicide prevention education provided includes the following:  Suicide risk factors  Suicide prevention and interventions  National Suicide Hotline telephone number  [facility] assessment telephone number  [facility] Emergency Assistance [place] and/or Residential Mobile Crisis Unit telephone number  Request made of family/significant other to:  Remove weapons (e.g., guns, rifles, knives), all items previously/currently identified as safety concern.    Remove drugs/medications (over-the-counter, prescriptions, illicit drugs), all items previously/currently identified as a safety concern.  The family member/significant other verbalizes understanding of the suicide prevention education information provided.  The family member/significant other agrees to remove the items of safety concern listed above.  CSW spoke with the patient's peer who was unaware that the patient was in the hospital.  She reports that she wants best for patient but does not have regular contact with patient.  She reports that patient has struggled with substance abuse issues and that "my brother passed on 2023-04-04 and that has been pretty hard for her".    Rozann Lesches 05/31/2019, 3:31 PM

## 2019-05-31 NOTE — Plan of Care (Signed)
Nurse discussed anxiety, depression and coping skills with patient.  

## 2019-05-31 NOTE — Progress Notes (Signed)
The focus of this group is to help patients establish daily goals to achieve during treatment and discuss how the patient can incorporate goal setting into their daily lives to aide in recovery. 

## 2019-05-31 NOTE — Progress Notes (Signed)
D:  Patient denied SI and HI while talking to nurse today, contracts for safety.  Denied A/V hallucinations.   A:  Medications administered per MD orders.  Emotional support and encouragement given patient. R:  Safety maintained with 15 minute checks. '

## 2019-05-31 NOTE — Progress Notes (Signed)
D:  Amanda Keith was in the day room much of the evening.  Minimal interaction with staff or peers.  She denied SI/HI or A/V hallucinations.  She was focused on foot and hip pain.  She questioned being able to be sent to the ED so she can have a cortisone shot in her hip as well as being able to use her elastic sleeve for her ankle.  She was encouraged to speak with MD regarding her concerns.  She took her hs medications without difficulty.   A:  1:1 with RN for support and encouragement.  Medications as ordered.  Q 15 minute checks maintained for safety.  Encouraged participation in group and unit activities.   R:  Amanda Keith remains safe on the unit.  We will continue to monitor the progress towards her goals.

## 2019-05-31 NOTE — Progress Notes (Signed)
The patient's positive event for the day is that she received her steroid shot for her back. The patient also stated that she felt better, ie.my "emotions are better". Her goal for tomorrow is to address her housing concerns.

## 2019-05-31 NOTE — Progress Notes (Signed)
Spiritual care group on grief and loss facilitated by chaplain Ameet Sandy  Group Goal:  Support / Education around grief and loss Members engage in facilitated group support and psycho social education.  Group Description:  Following introductions and group rules,  Group members engaged in facilitated group dialog and support around topic of loss, with particular support around experiences of loss in their lives. Group Identified types of loss (relationships / self / things) and identified patterns, circumstances, and changes that precipitate losses. Reflected on thoughts / feelings around loss, normalized grief responses, and recognized variety in grief experience. Patient Progress:  Pt did not attend group 

## 2019-06-01 NOTE — Plan of Care (Signed)
D: Patient is alert, minimal, pleasant, and cooperative. Denies SI, HI, AVH, and verbally contracts for safety. Affect is depressed. She is asking about an ankle brace.   A: Medications administered per MD order. Support provided. Patient educated on safety on the unit and medications. Routine safety checks every 15 minutes. Patient stated understanding to tell nurse about any new physical symptoms. Patient understands to tell staff of any needs.     R: No adverse drug reactions noted. Patient verbally contracts for safety. Patient remains safe at this time and will continue to monitor.   Problem: Education: Goal: Mental status will improve Outcome: Progressing   Problem: Safety: Goal: Periods of time without injury will increase Outcome: Progressing   Patient denies SI, HI, AVH, and contracts for safety. Patient remains safe and will continue to monitor.   [place] NOVEL CORONAVIRUS (COVID-19) DAILY CHECK-OFF SYMPTOMS - answer yes or no to each - every day NO YES  Have you had a fever in the past 24 hours?  Fever (Temp > 37.80C / 100F) X   Have you had any of these symptoms in the past 24 hours? New Cough  Sore Throat   Shortness of Breath  Difficulty Breathing  Unexplained Body Aches   X   Have you had any one of these symptoms in the past 24 hours not related to allergies?   Runny Nose  Nasal Congestion  Sneezing   X   If you have had runny nose, nasal congestion, sneezing in the past 24 hours, has it worsened?  X   EXPOSURES - check yes or no X   Have you traveled outside the state in the past 14 days?  X   Have you been in contact with someone with a confirmed diagnosis of COVID-19 or PUI in the past 14 days without wearing appropriate PPE?  X   Have you been living in the same home as a person with confirmed diagnosis of COVID-19 or a PUI (household contact)?    X   Have you been diagnosed with COVID-19?    X              What to do next: Answered NO to all:  Answered YES to anything:   Proceed with unit schedule Follow the [facility] Inpatient Flowsheet.

## 2019-06-01 NOTE — Progress Notes (Signed)
Patient attended the evening group session and answered all discussion questions prompted from this writer. Patient shared their goal for the day was to stay mindful of emotions. Patient also shared his new coping skill he learned was to journal while here. Patient rated their day a 7 out of 10 and his affect was appropriate.  

## 2019-06-01 NOTE — Progress Notes (Signed)
OT Cancellation Note  Patient Details Name: Amanda Keith MRN: [id number] DOB: [birth year]   Cancelled Treatment:    Reason Eval/Treat Not Completed: Other (comment) Pt seen after MD requesting follow up after having pain control administered for tendonitis/sciatica pain and the use of a RW. Pt shares hx of using a cane and ankle braces for pain management. Informed pt on use of cane at d/c, she shares she does not have one. MSW contacted for f/u on DME. Per screen, pt does not require use of RW this time and is safe to complete short functional distances on the unit without use of DME.  Zenovia Jarred, MSOT, OTR/L Behavioral Health OT/ Acute Relief OT PHP Office: [place] 06/01/2019, 5:31 PM

## 2019-06-01 NOTE — Plan of Care (Signed)
Progress note  D: pt found in bed; compliant with medication administration. Pt denies any physical pain. Pt appears to be sullen and sad. Pt is compliant with her walker. Pt has been in bed most of the day, but is pleasant in our interactions. Pt denies si/hi/ah/vh and verbally agrees to approach staff if these become apparent or before harming herself/others while at [place].  A: pt provided support and encouragement. Pt given medication per protocol and standing orders. Q73m safety checks implemented and continued.  R: pt safe on the unit. Will continue to monitor.   Pt progressing in the following metrics  Problem: Education: Goal: Knowledge of [place] [facility] Education information/materials will improve Outcome: Progressing Goal: Emotional status will improve Outcome: Progressing Goal: Mental status will improve Outcome: Progressing Goal: Verbalization of understanding the information provided will improve Outcome: Progressing

## 2019-06-01 NOTE — Care Management (Signed)
CMA sent referral to [facility].   CMA will follow up.   CMA will notify Education officer, museum.      Marciana Uplinger Care Management Assistant  Email:[email or web address] Office: [phone]

## 2019-06-02 NOTE — Progress Notes (Signed)
[facility]  06/02/2019 12:00 PM Laurine BlazerSandra Thomassen  MRN:  [id number] Subjective:  Patient is a 53 year old female with a past psychiatric history significant for schizoaffective disorder as well as cocaine use disorder who presented to the emergency department on 05/29/2019 with suicidal ideation.  Objective: Patient is a 53 year old female with the above-stated past psychiatric history seen in follow-up.  She continues to slowly improve.  She stated her mood is better.  She stated that she is having less crying spells.  She denied any auditory or visual hallucinations.  Occupational Therapy is seen the patient and recommended the cane after discharge.  She did state that her arthritic pain is better, and that the Kenalog injection as well as the meloxicam had benefited her.  She was able to smile and engage today.  Social work continues to attempt to get her into a facility for substances so that she may return to the caring services facility.  She is essentially homeless, and without those services she would be discharged to the street.  Her vital signs are stable.  She is afebrile.  She is reporting a 0 pain on the pain scale.  No withdrawal symptoms, she slept 6.75 hours last night.  No new laboratories.  Principal Problem: <principal problem not specified> Diagnosis: Active Problems:   Schizoaffective disorder (HCC)  Total Time spent with patient: 15 minutes  Past Psychiatric History: See admission H&P  Past Medical History:  Past Medical History:  Diagnosis Date  . Abuse, drug or alcohol (HCC)   . Anxiety   . Asthma   . Schizo affective schizophrenia (HCC)   . Scoliosis     Past Surgical History:  Procedure Laterality Date  . ABDOMINAL HYSTERECTOMY    . CESAREAN SECTION     Family History: History reviewed. No pertinent family history. Family Psychiatric  History: See admission H&P Social History:  Social History   Substance and Sexual Activity  Alcohol Use No      Social History   Substance and Sexual Activity  Drug Use Yes  . Types: Cocaine   Comment: clean x 1 month    Social History   Socioeconomic History  . Marital status: Single    Spouse name: Not on file  . Number of children: Not on file  . Years of education: Not on file  . Highest education level: Not on file  Occupational History  . Not on file  Social Needs  . Financial resource strain: Not on file  . Food insecurity    Worry: Not on file    Inability: Not on file  . Transportation needs    Medical: Not on file    Non-medical: Not on file  Tobacco Use  . Smoking status: Never Smoker  . Smokeless tobacco: Never Used  Substance and Sexual Activity  . Alcohol use: No  . Drug use: Yes    Types: Cocaine    Comment: clean x 1 month  . Sexual activity: Not on file    Comment: crack  Lifestyle  . Physical activity    Days per week: Not on file    Minutes per session: Not on file  . Stress: Not on file  Relationships  . Social Musicianconnections    Talks on phone: Not on file    Gets together: Not on file    Attends religious service: Not on file    Active member of club or organization: Not on file    Attends meetings of  clubs or organizations: Not on file    Relationship status: Not on file  Other Topics Concern  . Not on file  Social History Narrative  . Not on file   Additional Social History:                         Sleep: Good  Appetite:  Fair  Current Medications: Current Facility-Administered Medications  Medication Dose Route Frequency Provider Last Rate Last Dose  . acetaminophen (TYLENOL) tablet 650 mg  650 mg Oral Q6H PRN Mordecai Maes, NP   650 mg at 05/31/19 1017  . albuterol (VENTOLIN HFA) 108 (90 Base) MCG/ACT inhaler 1-2 puff  1-2 puff Inhalation Q4H PRN Derrill Center, NP   2 puff at 05/29/19 1950  . alum & mag hydroxide-simeth (MAALOX/MYLANTA) 200-200-20 MG/5ML suspension 30 mL  30 mL Oral Q4H PRN Mordecai Maes, NP      .  feeding supplement (ENSURE ENLIVE) (ENSURE ENLIVE) liquid 237 mL  237 mL Oral BID BM Derrill Center, NP   237 mL at 06/01/19 1124  . gabapentin (NEURONTIN) capsule 200 mg  200 mg Oral TID Sharma Covert, MD   200 mg at 06/02/19 0809  . magnesium hydroxide (MILK OF MAGNESIA) suspension 30 mL  30 mL Oral Daily PRN Mordecai Maes, NP      . meloxicam [facility]) tablet 7.5 mg  7.5 mg Oral Daily Sharma Covert, MD   7.5 mg at 06/02/19 0811  . multivitamin with minerals tablet 1 tablet  1 tablet Oral Daily Cobos, Myer Peer, MD   1 tablet at 06/02/19 0809  . risperiDONE (RISPERDAL) tablet 1 mg  1 mg Oral QHS Cobos, Myer Peer, MD   1 mg at 06/01/19 2144  . sertraline (ZOLOFT) tablet 50 mg  50 mg Oral QHS Cobos, Myer Peer, MD   50 mg at 06/01/19 2144  . traZODone (DESYREL) tablet 50 mg  50 mg Oral QHS PRN Cobos, Myer Peer, MD   50 mg at 06/01/19 2144    Lab Results: No results found for this or any previous visit (from the past 48 hour(s)).  Blood Alcohol level:  Lab Results  Component Value Date   ETH <10 05/27/2019   ETH <10 56/31/4970    Metabolic Disorder Labs: Lab Results  Component Value Date   HGBA1C 5.7 (H) 05/30/2019   MPG 116.89 05/30/2019   MPG 116.89 05/29/2019   Lab Results  Component Value Date   PROLACTIN 27.1 (H) 05/29/2019   Lab Results  Component Value Date   CHOL 180 05/30/2019   TRIG 110 05/30/2019   HDL 46 05/30/2019   CHOLHDL 3.9 05/30/2019   VLDL 22 05/30/2019   LDLCALC 112 (H) 05/30/2019   LDLCALC 117 (H) 05/29/2019    Physical Findings: AIMS: Facial and Oral Movements Muscles of Facial Expression: None, normal Lips and Perioral Area: None, normal Jaw: None, normal Tongue: None, normal,Extremity Movements Upper (arms, wrists, hands, fingers): None, normal Lower (legs, knees, ankles, toes): None, normal, Trunk Movements Neck, shoulders, hips: None, normal, Overall Severity Severity of abnormal movements (highest score from questions above):  None, normal Incapacitation due to abnormal movements: None, normal Patient's awareness of abnormal movements (rate only patient's report): No Awareness, Dental Status Current problems with teeth and/or dentures?: No Does patient usually wear dentures?: No  CIWA:  CIWA-Ar Total: 1 COWS:  COWS Total Score: 2  Musculoskeletal: Strength & Muscle Tone: decreased Gait & Station: unsteady Patient  leans: N/A  Psychiatric Specialty Exam: Physical Exam  Nursing note and vitals reviewed. Constitutional: She is oriented to person, place, and time. She appears well-developed and well-nourished.  HENT:  Head: Normocephalic and atraumatic.  Respiratory: Effort normal.  Neurological: She is alert and oriented to person, place, and time.    ROS  Blood pressure 103/82, pulse 78, temperature 97.7 F (36.5 C), temperature source Oral, resp. rate 16, height 5\' 4"  (1.626 m), weight 52.6 kg, last menstrual period 10/10/2011, SpO2 (!) 79 %.Body mass index is 19.91 kg/m.  General Appearance: Casual  Eye Contact:  Fair  Speech:  Normal Rate  Volume:  Normal  Mood:  Euthymic  Affect:  Congruent  Thought Process:  Coherent and Descriptions of Associations: Intact  Orientation:  Full (Time, Place, and Person)  Thought Content:  Logical  Suicidal Thoughts:  No  Homicidal Thoughts:  No  Memory:  Immediate;   Fair Recent;   Fair Remote;   Fair  Judgement:  Intact  Insight:  Fair  Psychomotor Activity:  Normal  Concentration:  Concentration: Fair and Attention Span: Fair  Recall:  FiservFair  Fund of Knowledge:  Fair  Language:  Fair  Akathisia:  Negative  Handed:  Right  AIMS (if indicated):     Assets:  Desire for Improvement Resilience  ADL's:  Intact  Cognition:  WNL  Sleep:  Number of Hours: 6.75     Treatment Plan Summary: Daily contact with patient to assess and evaluate symptoms and progress in treatment, Medication management and Plan : Patient is seen and examined.  Patient is a  53 year old female with the above-stated past psychiatric history who is seen in follow-up.  Diagnosis: #1 schizoaffective disorder; depressive type, #2 cocaine use disorder versus cocaine dependence, #3 sciatic nerve pain, #4 bilateral ankle tendinitis, #5 COPD  Patient continues to slowly improve.  Her pain and mood are better.  She is able to smile and engage.  We are waiting for disposition issues to become clear.  No change in her current medications. 1.  Continue gabapentin to 200 mg p.o. 3 times daily for chronic pain and anxiety. 2.  Continue Risperdal 1 mg p.o. nightly for psychosis. 3.  Continue sertraline 50 mg p.o. nightly for mood and anxiety. 4.  Continue trazodone 50 mg p.o. nightly as needed insomnia. 5.  Continue Ventolin HFA inhalation every 4 hours as needed wheezing. 6.  Kenalog 40 mg IM x1 for tendinitis and osteoarthritic pain given on 05/31/2019. 7.  Meloxicam 7.5 mg p.o. daily for osteoarthritic pain. 8.  Repeat pulse oximetry. 9.  Social work working with patient to gain substance rehabilitation admission. 10.  Disposition planning-in progress. Antonieta PertGreg Lawson Clary, MD 06/02/2019, 12:00 PM

## 2019-06-02 NOTE — [facility] Group Notes (Signed)
Pt did not attend orientation/goals group. 

## 2019-06-02 NOTE — Plan of Care (Signed)
D: Patient is alert, oriented, pleasant, and cooperative. Denies SI, HI, AVH, and verbally contracts for safety. Patient reports worrying about where she's going go after this. Affect and mood depressed and anxious. She reports talking to her social worker and needing to stay here longer to qualify for placement in a rehab. She reports she is homeless. Patient denies physical symptoms/pain.    A: Medications administered per MD order. Support provided. Patient educated on safety on the unit and medications. Routine safety checks every 15 minutes. Patient stated understanding to tell nurse about any new physical symptoms. Patient understands to tell staff of any needs.     R: No adverse drug reactions noted. Patient verbally contracts for safety. Patient remains safe at this time and will continue to monitor.   Problem: Education: Goal: Mental status will improve Outcome: Progressing   Problem: Safety: Goal: Periods of time without injury will increase Outcome: Progressing   Problem: Medication: Goal: Compliance with prescribed medication regimen will improve Outcome: Progressing  Patient denies SI, HI, AVH, and contracts for safety. Patient is willing to take medication as prescribed. Patient remains safe and will continue to monitor.    [place] NOVEL CORONAVIRUS (COVID-19) DAILY CHECK-OFF SYMPTOMS - answer yes or no to each - every day NO YES  Have you had a fever in the past 24 hours?  Fever (Temp > 37.80C / 100F) X   Have you had any of these symptoms in the past 24 hours? New Cough  Sore Throat   Shortness of Breath  Difficulty Breathing  Unexplained Body Aches   X   Have you had any one of these symptoms in the past 24 hours not related to allergies?   Runny Nose  Nasal Congestion  Sneezing   X   If you have had runny nose, nasal congestion, sneezing in the past 24 hours, has it worsened?  X   EXPOSURES - check yes or no X   Have you traveled outside the state in the  past 14 days?  X   Have you been in contact with someone with a confirmed diagnosis of COVID-19 or PUI in the past 14 days without wearing appropriate PPE?  X   Have you been living in the same home as a person with confirmed diagnosis of COVID-19 or a PUI (household contact)?    X   Have you been diagnosed with COVID-19?    X              What to do next: Answered NO to all: Answered YES to anything:   Proceed with unit schedule Follow the [facility] Inpatient Flowsheet.

## 2019-06-02 NOTE — Progress Notes (Signed)
Patient is very anxious regarding discharge. She was referred to [facility] and [facility] on 06/15, her referrals were received but have not been screened at this time. Patient's [place], so she was not able to be referred to [facility] residential.  Patient has made efforts to contact shelters, including [facility] and [facility], without success.   CSW called [facility], where patient was previously in their transitional housing program. CSW explained the situation and inquired if the patient would be able to discharge from [facility] to their program and waive their 14 day inpatient requirement. CSW offered to provide record of negative COVID tests or other supportive documentation. Unfortunately [facility] was not able to make an exception. Patient was updated.  Stephanie Acre, LCSW-A Clinical Social Worker

## 2019-06-02 NOTE — Progress Notes (Signed)
[facility] Group Notes:  (Nursing/MHT/Case Management/Adjunct)  Date:  06/02/2019  Time:  2030  Type of Therapy:  wrap up group  Participation Level:  Active  Participation Quality:  Attentive, Monopolizing, Redirectable, Sharing and Supportive  Affect:  Anxious and Appropriate  Cognitive:  Appropriate  Insight:  Improving  Engagement in Group:  Engaged  Modes of Intervention:  Clarification, Education and Support  Summary of Progress/Problems: Pt shares a lot about her struggles over her grief of losing her fiance and guilt over leaving long term treatment to use. Pt plans on stopping self medicating and finally accepting treatment for her mental health and addiction. Pt is very anxious about having somewhere to go at time of discharge. Pt is grateful for God and mental health treatment.  Shellia Cleverly 06/02/2019, 9:18 PM

## 2019-06-02 NOTE — Progress Notes (Signed)
CSW following up with residential treatment referrals.  Multiple calls and voicemails have been left for [facility] regarding a referral that was faxed on 06/15.  CSW spoke with admissions at [facility], they have not reviewed patient's referral, but all required documentation has been received.  Stephanie Acre, LCSW-A Clinical Social Worker

## 2019-06-02 NOTE — [facility] Group Notes (Signed)
[facility] Group Therapy 06/02/2019 2:44 PM  Type of Therapy: Mental [employer] Presentation  Participation Level: Active  Participation Quality: Attentive  Affect: Appropriate  Cognitive: Oriented  Insight: Developing/Improving  Engagement in Therapy: Engaged  Modes of Intervention: Discussion, Education and Socialization   Summary of Progress/Problems: Mental [employer] Association (MHA) Speaker came to talk about his personal journey with mental health. The pt processed ways by which to relate to the speaker. MHA speaker provided handouts and educational information pertaining to groups and services offered by the MHA. Pt was engaged in speaker's presentation and was receptive to resources provided.   Yoan Sallade, MSW, LCSWA 06/02/2019 2:44 PM 

## 2019-06-03 MED ORDER — MELOXICAM 15 MG PO TABS
15.0000 mg | ORAL_TABLET | Freq: Every day | ORAL | Status: DC
  Administered 2019-06-04 – 2019-06-10 (×7): 15 mg via ORAL
  Filled 2019-06-03: qty 1
  Filled 2019-06-03: qty 2
  Filled 2019-06-03 (×8): qty 1

## 2019-06-03 MED ORDER — SERTRALINE HCL 25 MG PO TABS
75.0000 mg | ORAL_TABLET | Freq: Every day | ORAL | Status: DC
  Administered 2019-06-03 – 2019-06-06 (×4): 75 mg via ORAL
  Filled 2019-06-03 (×6): qty 3

## 2019-06-03 MED ORDER — GABAPENTIN 300 MG PO CAPS
300.0000 mg | ORAL_CAPSULE | Freq: Three times a day (TID) | ORAL | Status: DC
  Administered 2019-06-03 – 2019-06-07 (×13): 300 mg via ORAL
  Filled 2019-06-03 (×19): qty 1

## 2019-06-03 MED ORDER — SERTRALINE HCL 25 MG PO TABS
25.0000 mg | ORAL_TABLET | Freq: Once | ORAL | Status: AC
  Administered 2019-06-03: 25 mg via ORAL
  Filled 2019-06-03 (×2): qty 1

## 2019-06-03 NOTE — Plan of Care (Signed)
  Problem: Coping: Goal: Ability to verbalize frustrations and anger appropriately will improve Outcome: Progressing   Problem: Activity: Goal: Interest or engagement in activities will improve Outcome: Not Progressing

## 2019-06-03 NOTE — Progress Notes (Signed)
[facility]  06/03/2019 10:03 AM Laurine BlazerSandra Mccrumb  MRN:  [id number] Subjective:  Patient is a 53 year old female with a past psychiatric history significant for schizoaffective disorder as well as cocaine use disorder who presented to the emergency department on 05/29/2019 with suicidal ideation.  Objective: Patient is a 53 year old female with the above-stated past psychiatric history who is seen in follow-up.  She stated her mood is a little bit worse today than yesterday.  She has been disappointed because of the lack of ability to get into a facility.  She has concern that if she does not have the 14-day stay either here or in an institution that she would not be able to go back to the housing per her caring services facility.  She also stated she is remaining in her room because of her ankle pain.  She stated she is unable to wear the braces that she has because their lace ups, and we discussed the possibility of maybe getting some Velcro things so she can ambulate.  She denied any auditory or visual hallucinations.  She denied any suicidal ideation.  Her vital signs are stable, she is afebrile.  She did report 0 out of 10 today for pain.  Nursing notes reflect she only slept 4.5 hours last night.  Principal Problem: <principal problem not specified> Diagnosis: Active Problems:   Schizoaffective disorder (HCC)  Total Time spent with patient: 15 minutes  Past Psychiatric History: See admission H&P  Past Medical History:  Past Medical History:  Diagnosis Date  . Abuse, drug or alcohol (HCC)   . Anxiety   . Asthma   . Schizo affective schizophrenia (HCC)   . Scoliosis     Past Surgical History:  Procedure Laterality Date  . ABDOMINAL HYSTERECTOMY    . CESAREAN SECTION     Family History: History reviewed. No pertinent family history. Family Psychiatric  History: See admission H&P Social History:  Social History   Substance and Sexual Activity  Alcohol Use No     Social  History   Substance and Sexual Activity  Drug Use Yes  . Types: Cocaine   Comment: clean x 1 month    Social History   Socioeconomic History  . Marital status: Single    Spouse name: Not on file  . Number of children: Not on file  . Years of education: Not on file  . Highest education level: Not on file  Occupational History  . Not on file  Social Needs  . Financial resource strain: Not on file  . Food insecurity    Worry: Not on file    Inability: Not on file  . Transportation needs    Medical: Not on file    Non-medical: Not on file  Tobacco Use  . Smoking status: Never Smoker  . Smokeless tobacco: Never Used  Substance and Sexual Activity  . Alcohol use: No  . Drug use: Yes    Types: Cocaine    Comment: clean x 1 month  . Sexual activity: Not on file    Comment: crack  Lifestyle  . Physical activity    Days per week: Not on file    Minutes per session: Not on file  . Stress: Not on file  Relationships  . Social Musicianconnections    Talks on phone: Not on file    Gets together: Not on file    Attends religious service: Not on file    Active member of club or organization: Not  on file    Attends meetings of clubs or organizations: Not on file    Relationship status: Not on file  Other Topics Concern  . Not on file  Social History Narrative  . Not on file   Additional Social History:                         Sleep: Fair  Appetite:  Fair  Current Medications: Current Facility-Administered Medications  Medication Dose Route Frequency Provider Last Rate Last Dose  . acetaminophen (TYLENOL) tablet 650 mg  650 mg Oral Q6H PRN [name], [name], NP   650 mg at 05/31/19 1017  . albuterol (VENTOLIN HFA) 108 (90 Base) MCG/ACT inhaler 1-2 puff  1-2 puff Inhalation Q4H PRN [name], Tanika N, NP   2 puff at 06/02/19 1620  . alum & mag hydroxide-simeth (MAALOX/MYLANTA) 200-200-20 MG/5ML suspension 30 mL  30 mL Oral Q4H PRN [name], [name], NP      . feeding  supplement (ENSURE ENLIVE) (ENSURE ENLIVE) liquid 237 mL  237 mL Oral BID BM [name], Tanika N, NP   237 mL at 06/02/19 1500  . gabapentin (NEURONTIN) capsule 200 mg  200 mg Oral TID Antonieta Pertlary, Darnetta Kesselman Lawson, MD   200 mg at 06/03/19 0755  . magnesium hydroxide (MILK OF MAGNESIA) suspension 30 mL  30 mL Oral Daily PRN [name], [name], NP      . meloxicam [facility](MOBIC) tablet 7.5 mg  7.5 mg Oral Daily Antonieta Pertlary, Graciela Plato Lawson, MD   7.5 mg at 06/03/19 0755  . multivitamin with minerals tablet 1 tablet  1 tablet Oral Daily Cobos, Rockey SituFernando A, MD   1 tablet at 06/03/19 0755  . risperiDONE (RISPERDAL) tablet 1 mg  1 mg Oral QHS Cobos, Rockey SituFernando A, MD   1 mg at 06/02/19 2153  . sertraline (ZOLOFT) tablet 50 mg  50 mg Oral QHS Cobos, Rockey SituFernando A, MD   50 mg at 06/02/19 2153  . traZODone (DESYREL) tablet 50 mg  50 mg Oral QHS PRN Cobos, Rockey SituFernando A, MD   50 mg at 06/02/19 2153    Lab Results: No results found for this or any previous visit (from the past 48 hour(s)).  Blood Alcohol level:  Lab Results  Component Value Date   ETH <10 05/27/2019   ETH <10 01/20/2019    Metabolic Disorder Labs: Lab Results  Component Value Date   HGBA1C 5.7 (H) 05/30/2019   MPG 116.89 05/30/2019   MPG 116.89 05/29/2019   Lab Results  Component Value Date   PROLACTIN 27.1 (H) 05/29/2019   Lab Results  Component Value Date   CHOL 180 05/30/2019   TRIG 110 05/30/2019   HDL 46 05/30/2019   CHOLHDL 3.9 05/30/2019   VLDL 22 05/30/2019   LDLCALC 112 (H) 05/30/2019   LDLCALC 117 (H) 05/29/2019    Physical Findings: AIMS: Facial and Oral Movements Muscles of Facial Expression: None, normal Lips and Perioral Area: None, normal Jaw: None, normal Tongue: None, normal,Extremity Movements Upper (arms, wrists, hands, fingers): None, normal Lower (legs, knees, ankles, toes): None, normal, Trunk Movements Neck, shoulders, hips: None, normal, Overall Severity Severity of abnormal movements (highest score from questions above): None,  normal Incapacitation due to abnormal movements: None, normal Patient's awareness of abnormal movements (rate only patient's report): No Awareness, Dental Status Current problems with teeth and/or dentures?: No Does patient usually wear dentures?: No  CIWA:  CIWA-Ar Total: 1 COWS:  COWS Total Score: 2  Musculoskeletal: Strength &  Muscle Tone: decreased Gait & Station: unsteady Patient leans: N/A  Psychiatric Specialty Exam: Physical Exam  Nursing note and vitals reviewed. Constitutional: She is oriented to person, place, and time. She appears well-developed and well-nourished.  HENT:  Head: Normocephalic and atraumatic.  Respiratory: Effort normal.  Neurological: She is alert and oriented to person, place, and time.    ROS  Blood pressure 97/75, pulse 87, temperature 98.1 F (36.7 C), temperature source Oral, resp. rate 20, height 5\' 4"  (1.626 m), weight 52.6 kg, last menstrual period 10/10/2011, SpO2 100 %.Body mass index is 19.91 kg/m.  General Appearance: Casual  Eye Contact:  Fair  Speech:  Normal Rate  Volume:  Decreased  Mood:  Anxious and Depressed  Affect:  Congruent  Thought Process:  Coherent and Descriptions of Associations: Intact  Orientation:  Full (Time, Place, and Person)  Thought Content:  Logical  Suicidal Thoughts:  No  Homicidal Thoughts:  No  Memory:  Immediate;   Fair Recent;   Fair Remote;   Fair  Judgement:  Intact  Insight:  Fair  Psychomotor Activity:  Decreased  Concentration:  Concentration: Fair and Attention Span: Fair  Recall:  FiservFair  Fund of Knowledge:  Fair  Language:  Fair  Akathisia:  Negative  Handed:  Right  AIMS (if indicated):     Assets:  Desire for Improvement Resilience  ADL's:  Intact  Cognition:  WNL  Sleep:  Number of Hours: 4.5     Treatment Plan Summary: Daily contact with patient to assess and evaluate symptoms and progress in treatment, Medication management and Plan : Patient is seen and examined.  Patient is  a 53 year old female with the above-stated past psychiatric history who is seen in follow-up.   Diagnosis: #1 schizoaffective disorder; depressive type, #2 cocaine use disorder versus cocaine dependence, #3 sciatic nerve pain, #4 bilateral ankle tendinitis, #5 COPD  Patient is seen in follow-up.  She does not looking as good today as she had yesterday.  I think the fears and concerns about not having a long enough stay or get into a facility to be able to return to the caring living facility she had been at.  I am going to increase her sertraline to 75 mg p.o. daily for mood and anxiety.  I am also going to increase her gabapentin 300 mg p.o. 3 times daily for chronic pain as well as anxiety.  I am going to continue the Risperdal as well as the trazodone at their current dosages with the hope that the gabapentin will decrease some of her anxiety so she will sleep better.  I am also going to increase her meloxicam to 15 mg p.o. daily for her arthritic pain.  We will see if they have some kind of elastic or Velcro things for her ankle so that she can get up and ambulate. 1.  Increase gabapentin to 300 mg p.o. 3 times daily for chronic pain and anxiety. 2.  Continue Risperdal 1 mg p.o. nightly for psychosis. 3.  Increase sertraline to 75 mg p.o. daily for mood and anxiety. 4.  Continue trazodone 50 mg p.o. q. at bedtime as needed insomnia. 5.  Continue Ventolin HFA and inhalation every 4 hours as needed wheezing. 6.  Increase meloxicam to 15 mg p.o. daily for osteoarthritic pain. 7.  We will see if we can get a hold of any kind of elastic bandage or Velcro bandage for her ankles to get her out of bed more frequently. 8.  Disposition  planning-in progress.  Sharma Covert, MD 06/03/2019, 10:03 AM

## 2019-06-03 NOTE — Tx Team (Signed)
Interdisciplinary Treatment and Diagnostic Plan Update  06/03/2019 Time of Session: 09:36am Amanda BlazerSandra Keith MRN: [id number]  Principal Diagnosis: <principal problem not specified>  Secondary Diagnoses: Active Problems:   Schizoaffective disorder (HCC)   Current Medications:  Current Facility-Administered Medications  Medication Dose Route Frequency Provider Last Rate Last Dose  . acetaminophen (TYLENOL) tablet 650 mg  650 mg Oral Q6H PRN [name], [name], NP   650 mg at 05/31/19 1017  . albuterol (VENTOLIN HFA) 108 (90 Base) MCG/ACT inhaler 1-2 puff  1-2 puff Inhalation Q4H PRN [name], Tanika N, NP   2 puff at 06/02/19 1620  . alum & mag hydroxide-simeth (MAALOX/MYLANTA) 200-200-20 MG/5ML suspension 30 mL  30 mL Oral Q4H PRN [name], [name], NP      . feeding supplement (ENSURE ENLIVE) (ENSURE ENLIVE) liquid 237 mL  237 mL Oral BID BM [name], Tanika N, NP   237 mL at 06/02/19 1500  . gabapentin (NEURONTIN) capsule 300 mg  300 mg Oral TID Antonieta Pertlary, [name], MD      . magnesium hydroxide (MILK OF MAGNESIA) suspension 30 mL  30 mL Oral Daily PRN [name], [name], NP      . Melene Muller[START ON 06/04/2019] meloxicam (MOBIC) tablet 15 mg  15 mg Oral Daily Antonieta Pertlary, [name], MD      . multivitamin with minerals tablet 1 tablet  1 tablet Oral Daily Cobos, Rockey SituFernando A, MD   1 tablet at 06/03/19 0755  . risperiDONE (RISPERDAL) tablet 1 mg  1 mg Oral QHS Cobos, Rockey SituFernando A, MD   1 mg at 06/02/19 2153  . sertraline (ZOLOFT) tablet 25 mg  25 mg Oral Once Antonieta Pertlary, [name], MD      . sertraline (ZOLOFT) tablet 75 mg  75 mg Oral QHS Antonieta Pertlary, [name], MD      . traZODone (DESYREL) tablet 50 mg  50 mg Oral QHS PRN Cobos, Rockey SituFernando A, MD   50 mg at 06/02/19 2153   PTA Medications: Medications Prior to Admission  Medication Sig Dispense Refill Last Dose  . acetaminophen (TYLENOL) 325 MG tablet Take 650 mg by mouth every 6 (six) hours as needed for mild pain or headache.     . albuterol (PROVENTIL HFA;VENTOLIN HFA)  108 (90 Base) MCG/ACT inhaler Inhale 1-2 puffs into the lungs every 6 (six) hours as needed for wheezing or shortness of breath. (Patient not taking: Reported on 05/27/2019) 18 g 0   . budesonide-formoterol (SYMBICORT) 80-4.5 MCG/ACT inhaler Inhale 2 puffs into the lungs 2 (two) times daily as needed (for asthma flares). (Patient not taking: Reported on 05/27/2019) 1 Inhaler 0   . cyclobenzaprine (FLEXERIL) 10 MG tablet Take 1 tablet (10 mg total) by mouth 2 (two) times daily as needed for muscle spasms. (Patient not taking: Reported on 12/30/2018) 20 tablet 0   . furosemide (LASIX) 20 MG tablet Take one tablet daily as needed for leg swelling. (Patient not taking: Reported on 12/30/2018) 10 tablet 0   . methocarbamol (ROBAXIN) 500 MG tablet Take 1 tablet (500 mg total) by mouth 2 (two) times daily. (Patient not taking: Reported on 12/30/2018) 10 tablet 0   . naproxen (NAPROSYN) 375 MG tablet Take 1 tablet (375 mg total) by mouth 2 (two) times daily. (Patient not taking: Reported on 12/30/2018) 20 tablet 0   . predniSONE (DELTASONE) 20 MG tablet 3 tabs po day one, then 2 tabs daily x 4 days (Patient not taking: Reported on 05/27/2019) 11 tablet 0     Patient Stressors: Financial difficulties  Loss of fiance Substance abuse Other: homeless  Patient Strengths: Average or above average intelligence Capable of independent living Communication skills Motivation for treatment/growth  Treatment Modalities: Medication Management, Group therapy, Case management,  1 to 1 session with clinician, Psychoeducation, Recreational therapy.   Physician Treatment Plan for Primary Diagnosis: <principal problem not specified> Long Term Goal(s): Improvement in symptoms so as ready for discharge Improvement in symptoms so as ready for discharge   Short Term Goals: Ability to identify changes in lifestyle to reduce recurrence of condition will improve Ability to verbalize feelings will improve Ability to disclose and  discuss suicidal ideas Ability to demonstrate self-control will improve Ability to identify and develop effective coping behaviors will improve Ability to maintain clinical measurements within normal limits will improve Ability to identify changes in lifestyle to reduce recurrence of condition will improve Ability to identify triggers associated with substance abuse/mental health issues will improve  Medication Management: Evaluate patient's response, side effects, and tolerance of medication regimen.  Therapeutic Interventions: 1 to 1 sessions, Unit Group sessions and Medication administration.  Evaluation of Outcomes: Progressing  Physician Treatment Plan for Secondary Diagnosis: Active Problems:   Schizoaffective disorder (HCC)  Long Term Goal(s): Improvement in symptoms so as ready for discharge Improvement in symptoms so as ready for discharge   Short Term Goals: Ability to identify changes in lifestyle to reduce recurrence of condition will improve Ability to verbalize feelings will improve Ability to disclose and discuss suicidal ideas Ability to demonstrate self-control will improve Ability to identify and develop effective coping behaviors will improve Ability to maintain clinical measurements within normal limits will improve Ability to identify changes in lifestyle to reduce recurrence of condition will improve Ability to identify triggers associated with substance abuse/mental health issues will improve     Medication Management: Evaluate patient's response, side effects, and tolerance of medication regimen.  Therapeutic Interventions: 1 to 1 sessions, Unit Group sessions and Medication administration.  Evaluation of Outcomes: Progressing   RN Treatment Plan for Primary Diagnosis: <principal problem not specified> Long Term Goal(s): Knowledge of disease and therapeutic regimen to maintain health will improve  Short Term Goals: Ability to participate in decision  making will improve, Ability to verbalize feelings will improve, Ability to disclose and discuss suicidal ideas, Ability to identify and develop effective coping behaviors will improve and Compliance with prescribed medications will improve  Medication Management: RN will administer medications as ordered by provider, will assess and evaluate patient's response and provide education to patient for prescribed medication. RN will report any adverse and/or side effects to prescribing provider.  Therapeutic Interventions: 1 on 1 counseling sessions, Psychoeducation, Medication administration, Evaluate responses to treatment, Monitor vital signs and CBGs as ordered, Perform/monitor CIWA, COWS, AIMS and Fall Risk screenings as ordered, Perform wound care treatments as ordered.  Evaluation of Outcomes: Not Progressing   LCSW Treatment Plan for Primary Diagnosis: <principal problem not specified> Long Term Goal(s): Safe transition to appropriate next level of care at discharge, Engage patient in therapeutic group addressing interpersonal concerns.  Short Term Goals: Engage patient in aftercare planning with referrals and resources, Identify triggers associated with mental health/substance abuse issues and Increase skills for wellness and recovery  Therapeutic Interventions: Assess for all discharge needs, 1 to 1 time with Social worker, Explore available resources and support systems, Assess for adequacy in community support network, Educate family and significant other(s) on suicide prevention, Complete Psychosocial Assessment, Interpersonal group therapy.  Evaluation of Outcomes: Progressing   Progress in Treatment:  Attending groups: Yes. Participating in groups: Yes. Taking medication as prescribed: Yes. Toleration medication: Yes. Family/Significant other contact made: Yes, individual(s) contacted:  deceased fiance's sister Patient understands diagnosis: Yes. Discussing patient identified  problems/goals with staff: Yes. Medical problems stabilized or resolved: Yes. Denies suicidal/homicidal ideation: Yes. Issues/concerns per patient self-inventory: No. Other:   New problem(s) identified: No, Describe:  None  New Short Term/Long Term Goal(s): Medication stabilization, elimination of SI thoughts, and development of a comprehensive mental wellness plan.   Patient Goals:  "Get back on my meds and work on my emotional stuff"  Discharge Plan or Barriers: CSW will continue to follow up for appropriate referrals and possible discharge planning. Patient was referred to [facility] and [facility], her referrals are pending  Reason for Continuation of Hospitalization: Anxiety Depression Medication stabilization  Estimated Length of Stay: 3-5 days  Attendees: Patient: 05/30/2019   Physician: Dr. Neita Garnet, MD 05/30/2019   Nursing: Benjamine Mola, RN 05/30/2019   RN Care Manager: 05/30/2019   Social Worker: Ardelle Anton, [place], [place] 05/30/2019   Recreational Therapist:  05/30/2019   Other:  05/30/2019   Other:  05/30/2019   Other: 05/30/2019     Scribe for Treatment Team: Joellen Jersey, [place] 06/03/2019 11:23 AM

## 2019-06-03 NOTE — Progress Notes (Signed)
Patient ID: Amanda Keith, female   DOB: [birth year], 53 y.o.   MRN: [id number]   NOVEL CORONAVIRUS (COVID-19) DAILY CHECK-OFF SYMPTOMS - answer yes or no to each - every day NO YES  Have you had a fever in the past 24 hours?  . Fever (Temp > 37.80C / 100F) X   Have you had any of these symptoms in the past 24 hours? . New Cough .  Sore Throat  .  Shortness of Breath .  Difficulty Breathing .  Unexplained Body Aches   X   Have you had any one of these symptoms in the past 24 hours not related to allergies?   . Runny Nose .  Nasal Congestion .  Sneezing   X   If you have had runny nose, nasal congestion, sneezing in the past 24 hours, has it worsened?  X   EXPOSURES - check yes or no X   Have you traveled outside the state in the past 14 days?  X   Have you been in contact with someone with a confirmed diagnosis of COVID-19 or PUI in the past 14 days without wearing appropriate PPE?  X   Have you been living in the same home as a person with confirmed diagnosis of COVID-19 or a PUI (household contact)?    X   Have you been diagnosed with COVID-19?    X              What to do next: Answered NO to all: Answered YES to anything:   Proceed with unit schedule Follow the [facility] Inpatient Flowsheet.

## 2019-06-03 NOTE — Progress Notes (Signed)
Did not attend group 

## 2019-06-03 NOTE — Progress Notes (Signed)
Recreation Therapy Notes  Date:  6.19.20 Time: 0930 Location: [address] Dayroom  Group Topic: Stress Management  Goal Area(s) Addresses:  Patient will identify positive stress management techniques. Patient will identify benefits of using stress management post d/c.  Intervention: Stress Management  Activity :  Progressive Muscle Relaxation.  LRT introduced the stress management technique of progressive muscle relaxation.  LRT lead patients in tensing each muscle group individually then releasing it.  Patients were to follow along as LRT lead them through the exercise.  Education:  Stress Management, Discharge Planning.   Education Outcome: Acknowledges Education  Clinical Observations/Feedback: Pt did not attend group.     Chez Bulnes, LRT/CTRS         Cathren Sween A 06/03/2019 11:14 AM 

## 2019-06-03 NOTE — Progress Notes (Signed)
Patient ID: Amanda Keith, female   DOB: July 05, 1966, 53 y.o.   MRN: [id number]   D: Pt alert and oriented on the unit and ambulates in her room and the hallway with walker. Pt denies SI/HI, A/VH. [name] affect was flat with depressed mood. Pt was isolative in her room in bed and did not attend any groups. Pt is cooperative on the unit.  A: Education, support and encouragement provided, q15 minute safety checks remain in effect. Medications administered per MD orders. R: No reactions/side effects to medicine noted. Pt denies any concerns at this time, and verbally contracts for safety. Pt remains safe on the unit.

## 2019-06-04 ENCOUNTER — Encounter (HOSPITAL_COMMUNITY): Payer: Self-pay | Admitting: Registered Nurse

## 2019-06-04 NOTE — Progress Notes (Signed)
D. Pt calm and cooperative- friendly during interactions-observed sitting in the milieu with minimal interaction with peers. Pt continues to complain of pain in both ankles due to chronic tendonitis. Pt currently denies SI/HI and AVH  A. Labs and vitals monitored. Pt compliant with medications. Tylenol given for bilateral ankle pain 7/10 Pt supported emotionally and encouraged to express concerns and ask questions.   R. Pt remains safe with 15 minute checks. Will continue POC.

## 2019-06-04 NOTE — Progress Notes (Signed)
DAR: Pt in the dayroom at the beginning of the shift interacting with peers. Pt stated her day has been good. Pt also would like if the doctor can be ordered braces to ware on her feet. Pt ambulates with a walker at this time and no fall reported so far. Denies pain, auditory and visual hallucinations. Maintained on routine safety checks.  Medications given as prescribed.  Support and encouragement offered as needed.  Will continue to monitor.

## 2019-06-04 NOTE — [facility] Group Notes (Signed)
[place] Group Notes:  (Nursing)  Date:  06/04/2019  Time: 130 PM Type of Therapy:  Nurse Education  Participation Level:  Did Not Attend  Waymond Cera 06/04/2019, 3:09 PM

## 2019-06-04 NOTE — [facility] Group Notes (Signed)
LCSW Group Therapy Note  06/04/2019     11:15AM-12:00PM  Type of Therapy and Topic:  Group Therapy:  Self Sabotage  Participation Level:  Minimal        . Description of Group:  Today's process group focused on the topic of Self Sabotage, what this is, and what methods of self-sabotage patients in the group have found themselves using.  Commonalities were then pointed out and the group explored possible benefits of choosing healthier coping skills.  Patients were asked to rate both their commitment to change and their confidence in their ability to change from 1 (lowest) to 10 (highest), then asked about their answers in order to provoke change talk.   Therapeutic Goals 1. Patient will be able to identify their typical methods of self sabotage. 2. Patient will list reasons they engage in these destructive behaviors, and harm that comes from them 3. Patient will be able verbalize the costs and benefits of drinking/drugging versus making the choice to change 4. Patient will rate their commitment to change and confidence about their ability to change, and will be guided to change talk.  Summary of Patient Progress: During group, patient expressed her typical manner of self-sabotage is drug use in a binge fashion because she is stressed and most recently is grieving.  Her commitment to change was rated at a 10 because she knows what life is like sober and that otherwise she is just making excuses and confidence in her ability to change was rated 10 because she has done it before successfully.  Therapeutic Modalities Stages of Change Motivational Interviewing  Amanda Dominion, LCSW 06/04/2019, 1:11 PM

## 2019-06-04 NOTE — Progress Notes (Signed)
[place] NOVEL CORONAVIRUS (COVID-19) DAILY CHECK-OFF SYMPTOMS - answer yes or no to each - every day NO YES  Have you had a fever in the past 24 hours?  . Fever (Temp > 37.80C / 100F) X   Have you had any of these symptoms in the past 24 hours? . New Cough .  Sore Throat  .  Shortness of Breath .  Difficulty Breathing .  Unexplained Body Aches   X   Have you had any one of these symptoms in the past 24 hours not related to allergies?   . Runny Nose .  Nasal Congestion .  Sneezing   X   If you have had runny nose, nasal congestion, sneezing in the past 24 hours, has it worsened?  X   EXPOSURES - check yes or no X   Have you traveled outside the state in the past 14 days?  X   Have you been in contact with someone with a confirmed diagnosis of COVID-19 or PUI in the past 14 days without wearing appropriate PPE?  X   Have you been living in the same home as a person with confirmed diagnosis of COVID-19 or a PUI (household contact)?    X   Have you been diagnosed with COVID-19?    X              What to do next: Answered NO to all: Answered YES to anything:   Proceed with unit schedule Follow the [facility] Inpatient Flowsheet.   

## 2019-06-04 NOTE — Progress Notes (Signed)
Patient requesting "braces with velcro" for her ankles- has chronic tendonitis- (Pt owns brace -with strings- that have been secured in locker)

## 2019-06-04 NOTE — Progress Notes (Signed)
D   Pt visible in the dayroom interacting and watching TV    Her behavior is appropriate and she is pleasant on approach    A   Verbal support given    Medications administered and effectiveness monitored   Q 15 min checks R   Pt remains safe at this time   NOVEL CORONAVIRUS (COVID-19) DAILY CHECK-OFF SYMPTOMS - answer yes or no to each - every day NO YES  Have you had a fever in the past 24 hours?  . Fever (Temp > 37.80C / 100F) X   Have you had any of these symptoms in the past 24 hours? . New Cough .  Sore Throat  .  Shortness of Breath .  Difficulty Breathing .  Unexplained Body Aches   X   Have you had any one of these symptoms in the past 24 hours not related to allergies?   . Runny Nose .  Nasal Congestion .  Sneezing   X   If you have had runny nose, nasal congestion, sneezing in the past 24 hours, has it worsened?  X   EXPOSURES - check yes or no X   Have you traveled outside the state in the past 14 days?  X   Have you been in contact with someone with a confirmed diagnosis of COVID-19 or PUI in the past 14 days without wearing appropriate PPE?  X   Have you been living in the same home as a person with confirmed diagnosis of COVID-19 or a PUI (household contact)?    X   Have you been diagnosed with COVID-19?    X              What to do next: Answered NO to all: Answered YES to anything:   Proceed with unit schedule Follow the [facility] Inpatient Flowsheet.

## 2019-06-04 NOTE — Progress Notes (Signed)
[facility]  06/04/2019 1:51 PM Laurine BlazerSandra Derenzo  MRN:  [id number]   Patient is a 53 year old female with a past psychiatric history significant for schizoaffective disorder as well as cocaine use disorder who presented to the emergency department on 05/29/2019 with suicidal ideation.  06/04/19 Subjective:  Patient reports she is feeling better except for the pain related to achilles tendonitis bilateral.    Patient seen face to face by this provider; chart reviewed and consulted with Dr. Jola Babinskilary on 06/04/19.  On evaluation Laurine BlazerSandra Wamboldt reports she is feeling better.  "When I first came I was feeling hopeless and didn't want to live"  At this time patient denies suicidal/self-harm/homicidal ideation, psychosis, and paranoia.  Patient rates depression 4/10 and anxiety 0/10 (scale 0/none and 10/worse).    During evaluation Laurine BlazerSandra Mcquary is laying in bed related to pain bilaterally achilles tendonitis; stating she just took Tylenol for the pain.  Patient reporting she is waiting on the braces that usually helps.  Patient reports she is eating/sleeping without difficulty, tolerating her medications without adverse reaction, and attending/participating in group sessions.  Patient  is alert/oriented x 4; calm/cooperative; and mood congruent with affect.  Patient is speaking in a clear tone at moderate volume, and normal pace; with good eye contact.  Her thought process is coherent and relevant; There is no indication that she is currently responding to internal/external stimuli or experiencing delusional thought content.  Patient denies suicidal/self-harm/homicidal ideation, psychosis, and paranoia.  Patient has remained calm throughout assessment and has answered questions appropriately.  Nursing order for   Principal Problem: <principal problem not specified> Diagnosis: Active Problems:   Schizoaffective disorder (HCC)  Total Time spent with patient: 15 minutes  Past Psychiatric History: See  admission H&P  Past Medical History:  Past Medical History:  Diagnosis Date  . Abuse, drug or alcohol (HCC)   . Anxiety   . Asthma   . Schizo affective schizophrenia (HCC)   . Scoliosis     Past Surgical History:  Procedure Laterality Date  . ABDOMINAL HYSTERECTOMY    . CESAREAN SECTION     Family History: History reviewed. No pertinent family history. Family Psychiatric  History: See admission H&P Social History:  Social History   Substance and Sexual Activity  Alcohol Use No     Social History   Substance and Sexual Activity  Drug Use Yes  . Types: Cocaine   Comment: clean x 1 month    Social History   Socioeconomic History  . Marital status: Single    Spouse name: Not on file  . Number of children: Not on file  . Years of education: Not on file  . Highest education level: Not on file  Occupational History  . Not on file  Social Needs  . Financial resource strain: Not on file  . Food insecurity    Worry: Not on file    Inability: Not on file  . Transportation needs    Medical: Not on file    Non-medical: Not on file  Tobacco Use  . Smoking status: Never Smoker  . Smokeless tobacco: Never Used  Substance and Sexual Activity  . Alcohol use: No  . Drug use: Yes    Types: Cocaine    Comment: clean x 1 month  . Sexual activity: Not on file    Comment: crack  Lifestyle  . Physical activity    Days per week: Not on file    Minutes per session: Not on file  .  Stress: Not on file  Relationships  . Social Musicianconnections    Talks on phone: Not on file    Gets together: Not on file    Attends religious service: Not on file    Active member of club or organization: Not on file    Attends meetings of clubs or organizations: Not on file    Relationship status: Not on file  Other Topics Concern  . Not on file  Social History Narrative  . Not on file   Additional Social History:   Sleep: Good  Appetite:  Good  Current Medications: Current  Facility-Administered Medications  Medication Dose Route Frequency Provider Last Rate Last Dose  . acetaminophen (TYLENOL) tablet 650 mg  650 mg Oral Q6H PRN [name], [name], NP   650 mg at 06/04/19 1259  . albuterol (VENTOLIN HFA) 108 (90 Base) MCG/ACT inhaler 1-2 puff  1-2 puff Inhalation Q4H PRN [name], Tanika N, NP   2 puff at 06/02/19 1620  . alum & mag hydroxide-simeth (MAALOX/MYLANTA) 200-200-20 MG/5ML suspension 30 mL  30 mL Oral Q4H PRN [name], [name], NP      . feeding supplement (ENSURE ENLIVE) (ENSURE ENLIVE) liquid 237 mL  237 mL Oral BID BM [name], Tanika N, NP   237 mL at 06/04/19 1000  . gabapentin (NEURONTIN) capsule 300 mg  300 mg Oral TID Antonieta Pertlary, [name], MD   300 mg at 06/04/19 1239  . magnesium hydroxide (MILK OF MAGNESIA) suspension 30 mL  30 mL Oral Daily PRN [name], [name], NP      . meloxicam (MOBIC) tablet 15 mg  15 mg Oral Daily Antonieta Pertlary, [name], MD   15 mg at 06/04/19 0756  . multivitamin with minerals tablet 1 tablet  1 tablet Oral Daily Cobos, Rockey SituFernando A, MD   1 tablet at 06/04/19 0756  . risperiDONE (RISPERDAL) tablet 1 mg  1 mg Oral QHS Cobos, Rockey SituFernando A, MD   1 mg at 06/03/19 2116  . sertraline (ZOLOFT) tablet 75 mg  75 mg Oral QHS Antonieta Pertlary, [name], MD   75 mg at 06/03/19 2116  . traZODone (DESYREL) tablet 50 mg  50 mg Oral QHS PRN Cobos, Rockey SituFernando A, MD   50 mg at 06/02/19 2153    Lab Results: No results found for this or any previous visit (from the past 48 hour(s)).  Blood Alcohol level:  Lab Results  Component Value Date   ETH <10 05/27/2019   ETH <10 01/20/2019    Metabolic Disorder Labs: Lab Results  Component Value Date   HGBA1C 5.7 (H) 05/30/2019   MPG 116.89 05/30/2019   MPG 116.89 05/29/2019   Lab Results  Component Value Date   PROLACTIN 27.1 (H) 05/29/2019   Lab Results  Component Value Date   CHOL 180 05/30/2019   TRIG 110 05/30/2019   HDL 46 05/30/2019   CHOLHDL 3.9 05/30/2019   VLDL 22 05/30/2019   LDLCALC 112 (H)  05/30/2019   LDLCALC 117 (H) 05/29/2019    Physical Findings: AIMS: Facial and Oral Movements Muscles of Facial Expression: None, normal Lips and Perioral Area: None, normal Jaw: None, normal Tongue: None, normal,Extremity Movements Upper (arms, wrists, hands, fingers): None, normal Lower (legs, knees, ankles, toes): None, normal, Trunk Movements Neck, shoulders, hips: None, normal, Overall Severity Severity of abnormal movements (highest score from questions above): None, normal Incapacitation due to abnormal movements: None, normal Patient's awareness of abnormal movements (rate only patient's report): No Awareness, Dental Status Current problems with teeth and/or  dentures?: No Does patient usually wear dentures?: No  CIWA:  CIWA-Ar Total: 1 COWS:  COWS Total Score: 2  Musculoskeletal: Strength & Muscle Tone: decreased Gait & Station: unsteady Patient leans: N/A  Psychiatric Specialty Exam: Physical Exam  Nursing note and vitals reviewed. Constitutional: She is oriented to person, place, and time. She appears well-developed and well-nourished.  HENT:  Head: Normocephalic and atraumatic.  Respiratory: Effort normal.  Neurological: She is alert and oriented to person, place, and time.    ROS   Blood pressure 98/79, pulse 77, temperature 97.8 F (36.6 C), temperature source Oral, resp. rate 20, height 5\' 4"  (1.626 m), weight 52.6 kg, last menstrual period 10/10/2011, SpO2 99 %.Body mass index is 19.91 kg/m.  General Appearance: Casual  Eye Contact:  Good  Speech:  Normal Rate  Volume:  Decreased  Mood:  Depressed  Affect:  Congruent  Thought Process:  Coherent and Descriptions of Associations: Intact  Orientation:  Full (Time, Place, and Person)  Thought Content:  Logical  Suicidal Thoughts:  No  Homicidal Thoughts:  No  Memory:  Immediate;   Good Recent;   Good Remote;   Good  Judgement:  Intact  Insight:  Present  Psychomotor Activity:  Decreased   Concentration:  Concentration: Fair and Attention Span: Fair  Recall:  Good  Fund of Knowledge:  Fair  Language:  Good  Akathisia:  Negative  Handed:  Right  AIMS (if indicated):     Assets:  Desire for Improvement Resilience  ADL's:  Intact  Cognition:  WNL  Sleep:  Number of Hours: 6.75     Treatment Plan Summary: Daily contact with patient to assess and evaluate symptoms and progress in treatment, Medication management and Plan : Patient is seen and examined.  Patient is a 53 year old female with the above-stated past psychiatric history who is seen in follow-up.   Diagnosis: #1 schizoaffective disorder; depressive type, #2 cocaine use disorder versus cocaine dependence, #3 sciatic nerve pain, #4 bilateral ankle tendinitis, #5 COPD  Patient is seen in follow-up.  She does not looking as good today as she had yesterday.  I think the fears and concerns about not having a long enough stay or get into a facility to be able to return to the caring living facility she had been at.  I am going to increase her sertraline to 75 mg p.o. daily for mood and anxiety.  I am also going to increase her gabapentin 300 mg p.o. 3 times daily for chronic pain as well as anxiety.  I am going to continue the Risperdal as well as the trazodone at their current dosages with the hope that the gabapentin will decrease some of her anxiety so she will sleep better.  I am also going to increase her meloxicam to 15 mg p.o. daily for her arthritic pain.  We will see if they have some kind of elastic or Velcro things for her ankle so that she can get up and ambulate.  06/04/19 Treatment plan:Continue with current treatment plan no changes at this time 1.  Continue gabapentin to 300 mg p.o. 3 times daily for chronic pain and anxiety. 2.  Continue Risperdal 1 mg p.o. nightly for psychosis. 3.  Continue sertraline to 75 mg p.o. daily for mood and anxiety. 4.  Continue trazodone 50 mg p.o. q. at bedtime as needed  insomnia. 5.  Continue Ventolin HFA and inhalation every 4 hours as needed wheezing. 6.  Continue meloxicam to 15 mg p.o. daily  for osteoarthritic pain. 7.  We will see if we can get a hold of any kind of elastic bandage or Velcro bandage for her ankles to get her out of bed more frequently. 8.  Disposition planning-in progress.   Ordered entered for Nursing to assess if PT will need to see for elastic brace or just an order.    Edna Grover, NP 06/04/2019, 1:51 PM

## 2019-06-05 MED ORDER — TUBERCULIN PPD 5 UNIT/0.1ML ID SOLN
5.0000 [IU] | Freq: Once | INTRADERMAL | Status: AC
  Administered 2019-06-05: 5 [IU] via INTRADERMAL

## 2019-06-05 NOTE — Progress Notes (Signed)
[place] NOVEL CORONAVIRUS (COVID-19) DAILY CHECK-OFF SYMPTOMS - answer yes or no to each - every day NO YES  Have you had a fever in the past 24 hours?  . Fever (Temp > 37.80C / 100F) X   Have you had any of these symptoms in the past 24 hours? . New Cough .  Sore Throat  .  Shortness of Breath .  Difficulty Breathing .  Unexplained Body Aches   X   Have you had any one of these symptoms in the past 24 hours not related to allergies?   . Runny Nose .  Nasal Congestion .  Sneezing   X   If you have had runny nose, nasal congestion, sneezing in the past 24 hours, has it worsened?  X   EXPOSURES - check yes or no X   Have you traveled outside the state in the past 14 days?  X   Have you been in contact with someone with a confirmed diagnosis of COVID-19 or PUI in the past 14 days without wearing appropriate PPE?  X   Have you been living in the same home as a person with confirmed diagnosis of COVID-19 or a PUI (household contact)?    X   Have you been diagnosed with COVID-19?    X              What to do next: Answered NO to all: Answered YES to anything:   Proceed with unit schedule Follow the [facility] Inpatient Flowsheet.   

## 2019-06-05 NOTE — Progress Notes (Deleted)
[place] NOVEL CORONAVIRUS (COVID-19) DAILY CHECK-OFF SYMPTOMS - answer yes or no to each - every day NO YES  Have you had a fever in the past 24 hours?  . Fever (Temp > 37.80C / 100F) X   Have you had any of these symptoms in the past 24 hours? . New Cough .  Sore Throat  .  Shortness of Breath .  Difficulty Breathing .  Unexplained Body Aches   X   Have you had any one of these symptoms in the past 24 hours not related to allergies?   . Runny Nose .  Nasal Congestion .  Sneezing   X   If you have had runny nose, nasal congestion, sneezing in the past 24 hours, has it worsened?  X   EXPOSURES - check yes or no X   Have you traveled outside the state in the past 14 days?  X   Have you been in contact with someone with a confirmed diagnosis of COVID-19 or PUI in the past 14 days without wearing appropriate PPE?  X   Have you been living in the same home as a person with confirmed diagnosis of COVID-19 or a PUI (household contact)?    X   Have you been diagnosed with COVID-19?    X              What to do next: Answered NO to all: Answered YES to anything:   Proceed with unit schedule Follow the [facility] Inpatient Flowsheet.   

## 2019-06-05 NOTE — Progress Notes (Addendum)
D. Pt out of her room for most of the shift- Per pt's self inventory this am, pt rated her depression, hopelessness and anxiety a 5/5/8, respectively. Pt is pleasant upon approach- improved mood overall and more social today- observed interacting well with peers in the dayroom. Pt currently denies SI/HI and AVH A. Labs and vitals monitored. Pt compliant with medications. Pt supported emotionally and encouraged to express concerns and ask questions.   R. Pt remains safe with 15 minute checks. Will continue POC.

## 2019-06-05 NOTE — Progress Notes (Signed)
[place] NOVEL CORONAVIRUS (COVID-19) DAILY CHECK-OFF SYMPTOMS - answer yes or no to each - every day NO YES  Have you had a fever in the past 24 hours?  . Fever (Temp > 37.80C / 100F) X   Have you had any of these symptoms in the past 24 hours? . New Cough .  Sore Throat  .  Shortness of Breath .  Difficulty Breathing .  Unexplained Body Aches   X   Have you had any one of these symptoms in the past 24 hours not related to allergies?   . Runny Nose .  Nasal Congestion .  Sneezing   X   If you have had runny nose, nasal congestion, sneezing in the past 24 hours, has it worsened?  X   EXPOSURES - check yes or no X   Have you traveled outside the state in the past 14 days?  X   Have you been in contact with someone with a confirmed diagnosis of COVID-19 or PUI in the past 14 days without wearing appropriate PPE?  X   Have you been living in the same home as a person with confirmed diagnosis of COVID-19 or a PUI (household contact)?    X   Have you been diagnosed with COVID-19?    X              What to do next: Answered NO to all: Answered YES to anything:   Proceed with unit schedule Follow the [facility] Inpatient Flowsheet.   

## 2019-06-05 NOTE — Progress Notes (Signed)
TB test done  - left forearm

## 2019-06-05 NOTE — Progress Notes (Signed)
[facility] MD Progress Note  06/05/2019 12:32 PM Dashonna Chagnon  MRN:  [id number] Subjective:  Patient is a 53 year old female with a past psychiatric history significant for schizoaffective disorder as well as cocaine use disorder who presented to the emergency department on 05/29/2019 with suicidal ideation.  Objective: Patient is a 53 year old female with the above-stated past psychiatric history who is seen in follow-up.  Her vital signs are stable, she is afebrile.  She slept 6.25 hours last night.  Outside of her ankle pain she is doing fine.  She has no other complaints with that.  We are still waiting for orthopedics to bring the ankle braces over, and I will try and reorder those.  She denied any auditory or visual hallucinations.  She denied any suicidal or homicidal ideation.  We are basically waiting for placement.  Principal Problem: <principal problem not specified> Diagnosis: Active Problems:   Schizoaffective disorder (HCC)  Total Time spent with patient: 15 minutes  Past Psychiatric History: See admission H&P  Past Medical History:  Past Medical History:  Diagnosis Date  . Abuse, drug or alcohol ([place])   . Anxiety   . Asthma   . Schizo affective schizophrenia ([place])   . Scoliosis     Past Surgical History:  Procedure Laterality Date  . ABDOMINAL HYSTERECTOMY    . CESAREAN SECTION     Family History: History reviewed. No pertinent family history. Family Psychiatric  History: See admission H&P Social History:  Social History   Substance and Sexual Activity  Alcohol Use No     Social History   Substance and Sexual Activity  Drug Use Yes  . Types: Cocaine   Comment: clean x 1 month    Social History   Socioeconomic History  . Marital status: Single    Spouse name: Not on file  . Number of children: Not on file  . Years of education: Not on file  . Highest education level: Not on file  Occupational History  . Not on file  Social Needs  . Financial resource  strain: Not on file  . Food insecurity    Worry: Not on file    Inability: Not on file  . Transportation needs    Medical: Not on file    Non-medical: Not on file  Tobacco Use  . Smoking status: Never Smoker  . Smokeless tobacco: Never Used  Substance and Sexual Activity  . Alcohol use: No  . Drug use: Yes    Types: Cocaine    Comment: clean x 1 month  . Sexual activity: Not on file    Comment: crack  Lifestyle  . Physical activity    Days per week: Not on file    Minutes per session: Not on file  . Stress: Not on file  Relationships  . Social Herbalist on phone: Not on file    Gets together: Not on file    Attends religious service: Not on file    Active member of club or organization: Not on file    Attends meetings of clubs or organizations: Not on file    Relationship status: Not on file  Other Topics Concern  . Not on file  Social History Narrative  . Not on file   Additional Social History:                         Sleep: Good  Appetite:  Good  Current Medications: Current Facility-Administered  Medications  Medication Dose Route Frequency Provider Last Rate Last Dose  . acetaminophen (TYLENOL) tablet 650 mg  650 mg Oral Q6H PRN [name], [name], NP   650 mg at 06/05/19 0818  . albuterol (VENTOLIN HFA) 108 (90 Base) MCG/ACT inhaler 1-2 puff  1-2 puff Inhalation Q4H PRN [name], Tanika N, NP   2 puff at 06/02/19 1620  . alum & mag hydroxide-simeth (MAALOX/MYLANTA) 200-200-20 MG/5ML suspension 30 mL  30 mL Oral Q4H PRN [name], [name], NP      . feeding supplement (ENSURE ENLIVE) (ENSURE ENLIVE) liquid 237 mL  237 mL Oral BID BM [name], Tanika N, NP   237 mL at 06/05/19 1013  . gabapentin (NEURONTIN) capsule 300 mg  300 mg Oral TID Antonieta Pertlary, Daneshia Tavano Lawson, MD   300 mg at 06/05/19 0818  . magnesium hydroxide (MILK OF MAGNESIA) suspension 30 mL  30 mL Oral Daily PRN [name], [name], NP      . meloxicam (MOBIC) tablet 15 mg  15 mg Oral Daily Antonieta Pertlary,  Jimya Ciani Lawson, MD   15 mg at 06/05/19 0818  . multivitamin with minerals tablet 1 tablet  1 tablet Oral Daily Cobos, Rockey SituFernando A, MD   1 tablet at 06/05/19 0818  . risperiDONE (RISPERDAL) tablet 1 mg  1 mg Oral QHS Cobos, Rockey SituFernando A, MD   1 mg at 06/04/19 2204  . sertraline (ZOLOFT) tablet 75 mg  75 mg Oral QHS Antonieta Pertlary, Seibert Keeter Lawson, MD   75 mg at 06/04/19 2204  . traZODone (DESYREL) tablet 50 mg  50 mg Oral QHS PRN Cobos, Rockey SituFernando A, MD   50 mg at 06/04/19 2204    Lab Results: No results found for this or any previous visit (from the past 48 hour(s)).  Blood Alcohol level:  Lab Results  Component Value Date   ETH <10 05/27/2019   ETH <10 01/20/2019    Metabolic Disorder Labs: Lab Results  Component Value Date   HGBA1C 5.7 (H) 05/30/2019   MPG 116.89 05/30/2019   MPG 116.89 05/29/2019   Lab Results  Component Value Date   PROLACTIN 27.1 (H) 05/29/2019   Lab Results  Component Value Date   CHOL 180 05/30/2019   TRIG 110 05/30/2019   HDL 46 05/30/2019   CHOLHDL 3.9 05/30/2019   VLDL 22 05/30/2019   LDLCALC 112 (H) 05/30/2019   LDLCALC 117 (H) 05/29/2019    Physical Findings: AIMS: Facial and Oral Movements Muscles of Facial Expression: None, normal Lips and Perioral Area: None, normal Jaw: None, normal Tongue: None, normal,Extremity Movements Upper (arms, wrists, hands, fingers): None, normal Lower (legs, knees, ankles, toes): None, normal, Trunk Movements Neck, shoulders, hips: None, normal, Overall Severity Severity of abnormal movements (highest score from questions above): None, normal Incapacitation due to abnormal movements: None, normal Patient's awareness of abnormal movements (rate only patient's report): No Awareness, Dental Status Current problems with teeth and/or dentures?: No Does patient usually wear dentures?: No  CIWA:  CIWA-Ar Total: 1 COWS:  COWS Total Score: 2  Musculoskeletal: Strength & Muscle Tone: decreased Gait & Station: unsteady Patient  leans: N/A  Psychiatric Specialty Exam: Physical Exam  Nursing note and vitals reviewed. Constitutional: She appears well-developed and well-nourished.  HENT:  Head: Normocephalic and atraumatic.  Respiratory: Effort normal.  Neurological: She is alert.    ROS  Blood pressure 92/63, pulse 70, temperature 98 F (36.7 C), temperature source Oral, resp. rate 16, height 5\' 4"  (1.626 m), weight 52.6 kg, last menstrual period 10/10/2011, SpO2 99 %.Body  mass index is 19.91 kg/m.  General Appearance: Casual  Eye Contact:  Good  Speech:  Normal Rate  Volume:  Normal  Mood:  Anxious  Affect:  Congruent  Thought Process:  Coherent and Descriptions of Associations: Intact  Orientation:  Full (Time, Place, and Person)  Thought Content:  Logical  Suicidal Thoughts:  No  Homicidal Thoughts:  No  Memory:  Immediate;   Fair Recent;   Fair Remote;   Fair  Judgement:  Intact  Insight:  Fair  Psychomotor Activity:  Normal  Concentration:  Concentration: Fair and Attention Span: Fair  Recall:  [employer]Fair  Fund of Knowledge:  Fair  Language:  Fair  Akathisia:  Negative  Handed:  Right  AIMS (if indicated):     Assets:  Desire for Improvement Resilience  ADL's:  Intact  Cognition:  WNL  Sleep:  Number of Hours: 6.25     Treatment Plan Summary: Daily contact with patient to assess and evaluate symptoms and progress in treatment, Medication management and Plan : Patient is seen and examined.  Patient is a 53 year old female with the above-stated past psychiatric history who is seen in follow-up.   Diagnosis: #1 schizoaffective disorder; depressive type, #2 cocaine use disorder versus cocaine dependence, #3 sciatic nerve pain, #4 bilateral ankle tendinitis, #5 COPD  Patient is seen and examined.  Patient is a 53 year old female who is seen in follow-up.  She is doing well.  We are just awaiting placement.  She continues to have problems with her ankle pain.  We are waiting for orthopedics to put  the brace 8.  Disposition planning-in progress.  Antonieta PertGreg Lawson Brylee Berk, MD 06/05/2019, 12:32 PMs on her.  No change in her current medications.  She apparently needs a TB skin test, and we will place that today.  Otherwise no change in her medications.  1.  Continue gabapentin to 300 mg p.o. 3 times daily for chronic pain and anxiety. 2.  Continue Risperdal 1 mg p.o. nightly for psychosis. 3.  Continue sertraline to 75 mg p.o. daily for mood and anxiety. 4.  Continue trazodone 50 mg p.o. q. at bedtime as needed insomnia. 5.  Continue Ventolin HFA and inhalation every 4 hours as needed wheezing. 6.  Continue meloxicam to 15 mg p.o. daily for osteoarthritic pain. 7.  Place TB skin test. 8.    Awaiting orthopedic to see if we can get her some form of non-tie up ankle braces. 9.  Disposition planning-in progress.

## 2019-06-05 NOTE — [facility] Group Notes (Signed)
[place] Group Notes: (Clinical Social Work)   06/05/2019      Type of Therapy:  Group Therapy   Participation Level:  Did Not Attend - was invited both individually by [facility] and by overhead announcement, chose not to attend.   Selmer Dominion, LCSW 06/05/2019, 2:26 PM

## 2019-06-06 NOTE — Progress Notes (Signed)
D: Patient is complaining about tendonitis and sciatica pain.  Patient is ambulating with a walker, and she appears to be tolerating it well.  Patient states that she feels like "I would be better in a wheelchair."  I informed patient that PT would like to see her ambulating.  She is concerned about her knee brace.  Explained to patient that the order is to be executed by PT.  Will attempt to contact PT this afternoon, as patient feels she will ambulate better with the "braces."  Patient denies any thoughts of self harm.  She rates her depression and anxiety as a 2; hopelessness as a 5.    A: Continue to monitor medication management and MD orders.  Safety checks completed every 15 minutes per protocol.  Offer support and encouragement as needed.  R: Patient is receptive to staff; her behavior is appropriate.

## 2019-06-06 NOTE — Progress Notes (Signed)
[place] NOVEL CORONAVIRUS (COVID-19) DAILY CHECK-OFF SYMPTOMS - answer yes or no to each - every day NO YES  Have you had a fever in the past 24 hours?  . Fever (Temp > 37.80C / 100F) X   Have you had any of these symptoms in the past 24 hours? . New Cough .  Sore Throat  .  Shortness of Breath .  Difficulty Breathing .  Unexplained Body Aches   X   Have you had any one of these symptoms in the past 24 hours not related to allergies?   . Runny Nose .  Nasal Congestion .  Sneezing   X   If you have had runny nose, nasal congestion, sneezing in the past 24 hours, has it worsened?  X   EXPOSURES - check yes or no X   Have you traveled outside the state in the past 14 days?  X   Have you been in contact with someone with a confirmed diagnosis of COVID-19 or PUI in the past 14 days without wearing appropriate PPE?  X   Have you been living in the same home as a person with confirmed diagnosis of COVID-19 or a PUI (household contact)?    X   Have you been diagnosed with COVID-19?    X              What to do next: Answered NO to all: Answered YES to anything:   Proceed with unit schedule Follow the [facility] Inpatient Flowsheet.   Mask was given and worn.  

## 2019-06-06 NOTE — Care Management (Signed)
CMA spoke with Admissions at [facility]. Patient has been approved for treatment. No beds available until Friday, 6/26.   CMA attempted to contact Admissions at [facility]. CMA will continue to follow up.    CMA will notify CSW, Stephanie Acre.      Emmagene Ortner Care Management Assistant  Email:[email or web address] Office: [phone]

## 2019-06-06 NOTE — Progress Notes (Signed)
CSW continuing to follow for discharge planning.  [facility] and [facility] referrals were made last week. Patient is still being reviewed. Patient inquired about [facility], they do not have shelter beds for women and the [facility] bus route is currently suspended.  CSW provided phone numbers to [facility] and [facility] for patient, as she offered to call and follow up with her referrals as well.  Stephanie Acre, LCSW-A Clinical Social Worker

## 2019-06-06 NOTE — Progress Notes (Signed)
[facility]  06/06/2019 11:20 AM Amanda BlazerSandra Keith  MRN:  [id number] Subjective:  Patient is a 53 year old female with a past psychiatric history significant for schizoaffective disorder as well as cocaine use disorder who presented to the emergency department on 05/29/2019 with suicidal ideation.  Objective: Patient is a 53 year old female with the above-stated past psychiatric history who is seen in follow-up.  She is doing well.  Her only issue today continues to be her ankle pain.  I am still hopeful that the orthopedic tech will come over and look for some ankle braces that do not have shoestring ties.  She denied any auditory or visual hallucinations.  She denied any suicidal ideation.  She denied any side effects from her current medications.  She continues to work with social work with regard to finding a facility to go to.  Her vital signs are stable, she is afebrile.  She slept 6.75 hours last night.  No new lab work.  Principal Problem: <principal problem not specified> Diagnosis: Active Problems:   Schizoaffective disorder (HCC)  Total Time spent with patient: 15 minutes  Past Psychiatric History: See admission H&P  Past Medical History:  Past Medical History:  Diagnosis Date  . Abuse, drug or alcohol (HCC)   . Anxiety   . Asthma   . Schizo affective schizophrenia (HCC)   . Scoliosis     Past Surgical History:  Procedure Laterality Date  . ABDOMINAL HYSTERECTOMY    . CESAREAN SECTION     Family History: History reviewed. No pertinent family history. Family Psychiatric  History: See admission H&P Social History:  Social History   Substance and Sexual Activity  Alcohol Use No     Social History   Substance and Sexual Activity  Drug Use Yes  . Types: Cocaine   Comment: clean x 1 month    Social History   Socioeconomic History  . Marital status: Single    Spouse name: Not on file  . Number of children: Not on file  . Years of education: Not on file  .  Highest education level: Not on file  Occupational History  . Not on file  Social Needs  . Financial resource strain: Not on file  . Food insecurity    Worry: Not on file    Inability: Not on file  . Transportation needs    Medical: Not on file    Non-medical: Not on file  Tobacco Use  . Smoking status: Never Smoker  . Smokeless tobacco: Never Used  Substance and Sexual Activity  . Alcohol use: No  . Drug use: Yes    Types: Cocaine    Comment: clean x 1 month  . Sexual activity: Not on file    Comment: crack  Lifestyle  . Physical activity    Days per week: Not on file    Minutes per session: Not on file  . Stress: Not on file  Relationships  . Social Musicianconnections    Talks on phone: Not on file    Gets together: Not on file    Attends religious service: Not on file    Active member of club or organization: Not on file    Attends meetings of clubs or organizations: Not on file    Relationship status: Not on file  Other Topics Concern  . Not on file  Social History Narrative  . Not on file   Additional Social History:  Sleep: Good  Appetite:  Fair  Current Medications: Current Facility-Administered Medications  Medication Dose Route Frequency Provider Last Rate Last Dose  . acetaminophen (TYLENOL) tablet 650 mg  650 mg Oral Q6H PRN [name], [name], NP   650 mg at 06/05/19 1518  . albuterol (VENTOLIN HFA) 108 (90 Base) MCG/ACT inhaler 1-2 puff  1-2 puff Inhalation Q4H PRN [name], Tanika N, NP   2 puff at 06/02/19 1620  . alum & mag hydroxide-simeth (MAALOX/MYLANTA) 200-200-20 MG/5ML suspension 30 mL  30 mL Oral Q4H PRN [name], [name], NP      . feeding supplement (ENSURE ENLIVE) (ENSURE ENLIVE) liquid 237 mL  237 mL Oral BID BM [name], Tanika N, NP   237 mL at 06/06/19 1027  . gabapentin (NEURONTIN) capsule 300 mg  300 mg Oral TID Antonieta Pertlary, Caige Almeda Lawson, MD   300 mg at 06/06/19 0814  . magnesium hydroxide (MILK OF MAGNESIA) suspension 30  mL  30 mL Oral Daily PRN [name], [name], NP      . meloxicam [facility](MOBIC) tablet 15 mg  15 mg Oral Daily Antonieta Pertlary, Hiro Vipond Lawson, MD   15 mg at 06/06/19 [phone]  . multivitamin with minerals tablet 1 tablet  1 tablet Oral Daily Cobos, Rockey SituFernando A, MD   1 tablet at 06/06/19 [phone]  . risperiDONE (RISPERDAL) tablet 1 mg  1 mg Oral QHS Cobos, Rockey SituFernando A, MD   1 mg at 06/05/19 2116  . sertraline (ZOLOFT) tablet 75 mg  75 mg Oral QHS Antonieta Pertlary, Jerelle Virden Lawson, MD   75 mg at 06/05/19 2116  . traZODone (DESYREL) tablet 50 mg  50 mg Oral QHS PRN Cobos, Rockey SituFernando A, MD   50 mg at 06/05/19 2116  . tuberculin injection 5 Units  5 Units Intradermal Once Antonieta Pertlary, Isac Lincks Lawson, MD   5 Units at 06/05/19 1401    Lab Results: No results found for this or any previous visit (from the past 48 hour(s)).  Blood Alcohol level:  Lab Results  Component Value Date   ETH <10 05/27/2019   ETH <10 01/20/2019    Metabolic Disorder Labs: Lab Results  Component Value Date   HGBA1C 5.7 (H) 05/30/2019   MPG 116.89 05/30/2019   MPG 116.89 05/29/2019   Lab Results  Component Value Date   PROLACTIN 27.1 (H) 05/29/2019   Lab Results  Component Value Date   CHOL 180 05/30/2019   TRIG 110 05/30/2019   HDL 46 05/30/2019   CHOLHDL 3.9 05/30/2019   VLDL 22 05/30/2019   LDLCALC 112 (H) 05/30/2019   LDLCALC 117 (H) 05/29/2019    Physical Findings: AIMS: Facial and Oral Movements Muscles of Facial Expression: None, normal Lips and Perioral Area: None, normal Jaw: None, normal Tongue: None, normal,Extremity Movements Upper (arms, wrists, hands, fingers): None, normal Lower (legs, knees, ankles, toes): None, normal, Trunk Movements Neck, shoulders, hips: None, normal, Overall Severity Severity of abnormal movements (highest score from questions above): None, normal Incapacitation due to abnormal movements: None, normal Patient's awareness of abnormal movements (rate only patient's report): No Awareness, Dental Status Current problems  with teeth and/or dentures?: No Does patient usually wear dentures?: No  CIWA:  CIWA-Ar Total: 1 COWS:  COWS Total Score: 2  Musculoskeletal: Strength & Muscle Tone: abnormal Gait & Station: unsteady Patient leans: N/A  Psychiatric Specialty Exam: Physical Exam  Nursing note and vitals reviewed. Constitutional: She is oriented to person, place, and time. She appears well-developed and well-nourished.  HENT:  Head: Normocephalic and atraumatic.  Respiratory: Effort normal.  Neurological: She is alert and oriented to person, place, and time.    ROS  Blood pressure 108/76, pulse 61, temperature 98.1 F (36.7 C), temperature source Oral, resp. rate 16, height 5\' 4"  (1.626 m), weight 52.6 kg, last menstrual period 10/10/2011, SpO2 99 %.Body mass index is 19.91 kg/m.  General Appearance: Casual  Eye Contact:  Good  Speech:  Normal Rate  Volume:  Normal  Mood:  Euthymic  Affect:  Congruent  Thought Process:  Coherent and Descriptions of Associations: Intact  Orientation:  Full (Time, Place, and Person)  Thought Content:  Logical  Suicidal Thoughts:  No  Homicidal Thoughts:  No  Memory:  Immediate;   Fair Recent;   Fair Remote;   Fair  Judgement:  Intact  Insight:  Fair  Psychomotor Activity:  Normal  Concentration:  Concentration: Fair and Attention Span: Fair  Recall:  [employer] of Knowledge:  Fair  Language:  Good  Akathisia:  Negative  Handed:  Right  AIMS (if indicated):     Assets:  Desire for Improvement Resilience  ADL's:  Impaired  Cognition:  WNL  Sleep:  Number of Hours: 6.75     Treatment Plan Summary: Daily contact with patient to assess and evaluate symptoms and progress in treatment, Medication management and Plan : Patient is seen and examined.  Patient is a 53 year old female with the above-stated past psychiatric history who is seen in follow-up.   Diagnosis: #1 schizoaffective disorder; depressive type, #2 cocaine use disorder versus cocaine  dependence, #3 sciatic nerve pain, #4 bilateral ankle tendinitis, #5 COPD  Patient continues to improve.  No change in her medications today.  She was waiting for placement in any appropriate facility.  We are still waiting for orthopedic tech to come take a look at her ankles for braces. 1.Continuegabapentin to 300 mg p.o. 3 times daily for chronic pain and anxiety. 2. Continue Risperdal 1 mg p.o. nightly for psychosis. 3.Continuesertraline to 75 mg p.o. daily for mood and anxiety. 4. Continue trazodone 50 mg p.o. q. at bedtime as needed insomnia. 5. Continue Ventolin HFA and inhalation every 4 hours as needed wheezing. 6.Continuemeloxicam 15 mg p.o. daily for osteoarthritic pain. 7. Awaiting orthopedic to see if we can get her some form of non-tie up ankle braces. 9.  Disposition planning-in progress.  Sharma Covert, MD 06/06/2019, 11:20 AM

## 2019-06-06 NOTE — Progress Notes (Signed)
Amanda Keith observe in dayroom, seen interacting with peers. She appears animated on approach. She denies SI/HI/AVH at this time. Pt stated that she was excited for [facility]. Pt states "If not, I wouldn't have anywhere to go". Ensures given as ordered. Support offered. Will continue with POC.

## 2019-06-07 MED ORDER — SERTRALINE HCL 100 MG PO TABS
100.0000 mg | ORAL_TABLET | Freq: Every day | ORAL | Status: DC
  Administered 2019-06-07 – 2019-06-09 (×3): 100 mg via ORAL
  Filled 2019-06-07 (×5): qty 1

## 2019-06-07 MED ORDER — GABAPENTIN 400 MG PO CAPS
400.0000 mg | ORAL_CAPSULE | Freq: Three times a day (TID) | ORAL | Status: DC
  Administered 2019-06-07 – 2019-06-10 (×8): 400 mg via ORAL
  Filled 2019-06-07 (×14): qty 1

## 2019-06-07 NOTE — Progress Notes (Signed)
Writer notified OT at [phone] for bilat ankle braces per Dr. Mallie Darting orders. Dr. Mallie Darting stated that the pt needs a brace for both ankles due to tendonitis.

## 2019-06-07 NOTE — [facility] Group Notes (Signed)
Adult Psychoeducational Group Note  Date:  06/07/2019 Time:  10:23 AM  Group Topic/Focus:  Goals Group:   The focus of this group is to help patients establish daily goals to achieve during treatment and discuss how the patient can incorporate goal setting into their daily lives to aide in recovery.  Participation Level:  Active  Participation Quality:  Appropriate  Affect:  Appropriate  Cognitive:  Alert  Insight: Appropriate  Engagement in Group:  Engaged  Modes of Intervention:  Orientation  Additional Comments:  Pt attended and participated in orientation/goasl group. Pt goal for today is to focus on moving forward in a positive way.   Huel Cote 06/07/2019, 10:23 AM

## 2019-06-07 NOTE — Progress Notes (Signed)
   06/07/19 2138  COVID-19 Daily Checkoff  Have you had a fever (temp > 37.80C/100F)  in the past 24 hours?  No  COVID-19 EXPOSURE  Have you traveled outside the state in the past 14 days? No  Have you been in contact with someone with a confirmed diagnosis of COVID-19 or PUI in the past 14 days without wearing appropriate PPE? No  Have you been living in the same home as a person with confirmed diagnosis of COVID-19 or a PUI (household contact)? No  Have you been diagnosed with COVID-19? No

## 2019-06-07 NOTE — Progress Notes (Signed)
Pt rates depression 6/10, anxiety 6/10, and hopelessness 3/10. Pt denies SI/HI. Pt reported good sleep last night. Pt compliant with taking meds and denies any side effects. Pt stated goal "focus on moving forward in a positive way and don't dwell on past events."  Medications administered as ordered per MD. Verbal support provided. Pt encouraged to attend groups. 15 minute checks performed for safety. Writer notified OT in regards to ankle braces for pt.   Pt receptive to tx plan.

## 2019-06-07 NOTE — Progress Notes (Addendum)
Spiritual care group on grief and loss facilitated by chaplain Jerene Pitch  Group Goal:  Support / Education around grief and loss Members engage in facilitated group support and psycho social education.  Group Description:  Following introductions and group rules,  Group members engaged in facilitated group dialog and support around topic of loss, with particular support around experiences of loss in their lives. Group Identified types of loss (relationships / self / things) and identified patterns, circumstances, and changes that precipitate losses. Reflected on thoughts / feelings around loss, normalized grief responses, and recognized variety in grief experience. Patient Progress: Amanda Keith was attentive at beginning of group.  Increasingly active in group discussion as group progressed.  Used group time to process her feelings around death of her fiance two months prior.  Stated her family narrative is to "get over it" and feels frustrated with people in her life who don't understand her grief process.  Identified elements of grief process that were not affirmed / noticed by others outside hospital - grief around hopes, identity change, stage in life.   [name] identified elements that are important to her in her grief process.  Received affirmation, normalization, and support from group members.  [name] expressed awareness of grief as ongoing process.  Is aware of supports outside of hospital context.

## 2019-06-07 NOTE — Progress Notes (Signed)
[facility] MD Progress Note  06/07/2019 3:30 PM Amanda Keith  MRN:  [id number] Subjective: Patient reports overall improvement compared to admission.  She denies suicidal ideations at this time.  She denies medication side effects.  She is focused on issues regarding her ankle braces.  Currently presents future oriented and states she is looking forward to going to a rehab setting at discharge.  Objective: I have discussed case with treatment team and have met with patient .  53 year old single female, currently homeless, presented voluntarily to ED reporting worsening depression, neuro-vegetative symptoms, demeaning/hypercritical intermittent auditory hallucinations, passive SI, following the death of her fiance three months ago. She has a history of Schizoaffective Disorder diagnosis. She also reports history of Cocaine Use Disorder and relapsed on Cocaine several weeks ago.  Today patient acknowledges noticeable improvement compared to admission although describes some lingering depression and anxiety.  Currently denies suicidal ideations and is future oriented.  She states she is planning on going to a rehab after discharge.  Currently denies medication side effects (currently on Zoloft, Neurontin, Risperidone). Describes improved auditory hallucinations and currently does not appear internally preoccupied.  No delusions are expressed.  Patient reports a history of ankle pain related to "tendinitis".  For this she was using some ankle braces which have long cords/ laces to secure and have therefore not been used on this inpatient psychiatric setting.  I have spoken with OT regarding trying to obtain braces secured by Velcro or other system that would be appropriate for use in this setting.  Unfortunately, they state they only have ones with laces but will look into alternative options.  Of note, patient appears comfortable, calm, in no acute distress, and states she is ambulating better than on admission.   Visible in dayroom, no disruptive or agitated behaviors.  Principal Problem: <principal problem not specified> Diagnosis: Active Problems:   Schizoaffective disorder (HCC)  Total Time spent with patient: 20 minutes  Past Psychiatric History: See admission H&P  Past Medical History:  Past Medical History:  Diagnosis Date  . Abuse, drug or alcohol ([place])   . Anxiety   . Asthma   . Schizo affective schizophrenia ([place])   . Scoliosis     Past Surgical History:  Procedure Laterality Date  . ABDOMINAL HYSTERECTOMY    . CESAREAN SECTION     Family History: History reviewed. No pertinent family history. Family Psychiatric  History: See admission H&P Social History:  Social History   Substance and Sexual Activity  Alcohol Use No     Social History   Substance and Sexual Activity  Drug Use Yes  . Types: Cocaine   Comment: clean x 1 month    Social History   Socioeconomic History  . Marital status: Single    Spouse name: Not on file  . Number of children: Not on file  . Years of education: Not on file  . Highest education level: Not on file  Occupational History  . Not on file  Social Needs  . Financial resource strain: Not on file  . Food insecurity    Worry: Not on file    Inability: Not on file  . Transportation needs    Medical: Not on file    Non-medical: Not on file  Tobacco Use  . Smoking status: Never Smoker  . Smokeless tobacco: Never Used  Substance and Sexual Activity  . Alcohol use: No  . Drug use: Yes    Types: Cocaine    Comment: clean  x 1 month  . Sexual activity: Not on file    Comment: crack  Lifestyle  . Physical activity    Days per week: Not on file    Minutes per session: Not on file  . Stress: Not on file  Relationships  . Social Herbalist on phone: Not on file    Gets together: Not on file    Attends religious service: Not on file    Active member of club or organization: Not on file    Attends meetings of clubs or  organizations: Not on file    Relationship status: Not on file  Other Topics Concern  . Not on file  Social History Narrative  . Not on file   Additional Social History:   Sleep: Good  Appetite:  Improving  Current Medications: Current Facility-Administered Medications  Medication Dose Route Frequency Provider Last Rate Last Dose  . acetaminophen (TYLENOL) tablet 650 mg  650 mg Oral Q6H PRN Mordecai Maes, NP   650 mg at 06/06/19 1257  . albuterol (VENTOLIN HFA) 108 (90 Base) MCG/ACT inhaler 1-2 puff  1-2 puff Inhalation Q4H PRN Derrill Center, NP   2 puff at 06/02/19 1620  . alum & mag hydroxide-simeth (MAALOX/MYLANTA) 200-200-20 MG/5ML suspension 30 mL  30 mL Oral Q4H PRN Mordecai Maes, NP   30 mL at 06/07/19 0933  . feeding supplement (ENSURE ENLIVE) (ENSURE ENLIVE) liquid 237 mL  237 mL Oral BID BM Derrill Center, NP   237 mL at 06/07/19 0933  . gabapentin (NEURONTIN) capsule 300 mg  300 mg Oral TID Sharma Covert, MD   300 mg at 06/07/19 1149  . magnesium hydroxide (MILK OF MAGNESIA) suspension 30 mL  30 mL Oral Daily PRN Mordecai Maes, NP      . meloxicam (MOBIC) tablet 15 mg  15 mg Oral Daily Sharma Covert, MD   15 mg at 06/07/19 0839  . multivitamin with minerals tablet 1 tablet  1 tablet Oral Daily Bryar Rennie, Myer Peer, MD   1 tablet at 06/07/19 0839  . risperiDONE (RISPERDAL) tablet 1 mg  1 mg Oral QHS Chayse Zatarain, Myer Peer, MD   1 mg at 06/06/19 2134  . sertraline (ZOLOFT) tablet 100 mg  100 mg Oral QHS Dominik Lauricella A, MD      . traZODone (DESYREL) tablet 50 mg  50 mg Oral QHS PRN Mannat Benedetti, Myer Peer, MD   50 mg at 06/06/19 2134    Lab Results: No results found for this or any previous visit (from the past 48 hour(s)).  Blood Alcohol level:  Lab Results  Component Value Date   ETH <10 05/27/2019   ETH <10 61/44/3154    Metabolic Disorder Labs: Lab Results  Component Value Date   HGBA1C 5.7 (H) 05/30/2019   MPG 116.89 05/30/2019   MPG 116.89  05/29/2019   Lab Results  Component Value Date   PROLACTIN 27.1 (H) 05/29/2019   Lab Results  Component Value Date   CHOL 180 05/30/2019   TRIG 110 05/30/2019   HDL 46 05/30/2019   CHOLHDL 3.9 05/30/2019   VLDL 22 05/30/2019   LDLCALC 112 (H) 05/30/2019   LDLCALC 117 (H) 05/29/2019    Physical Findings: AIMS: Facial and Oral Movements Muscles of Facial Expression: None, normal Lips and Perioral Area: None, normal Jaw: None, normal Tongue: None, normal,Extremity Movements Upper (arms, wrists, hands, fingers): None, normal Lower (legs, knees, ankles, toes): None, normal, Trunk Movements Neck, shoulders,  hips: None, normal, Overall Severity Severity of abnormal movements (highest score from questions above): None, normal Incapacitation due to abnormal movements: None, normal Patient's awareness of abnormal movements (rate only patient's report): No Awareness, Dental Status Current problems with teeth and/or dentures?: No Does patient usually wear dentures?: No  CIWA:  CIWA-Ar Total: 1 COWS:  COWS Total Score: 2  Musculoskeletal: Strength & Muscle Tone: within normal limits Gait & Station: Reports improving gait, currently walks with a walker assistance Patient leans: N/A  Psychiatric Specialty Exam: Physical Exam  Nursing note and vitals reviewed. Constitutional: She is oriented to person, place, and time. She appears well-developed and well-nourished.  HENT:  Head: Normocephalic and atraumatic.  Respiratory: Effort normal.  Neurological: She is alert and oriented to person, place, and time.    ROS reports chronic ankle discomfort/pain related to "tendinitis".  No chest pain, no shortness of breath, no cough, no fever, no chills  Blood pressure 99/78, pulse 75, temperature 97.7 F (36.5 C), temperature source Oral, resp. rate 16, height '5\' 4"'  (1.626 m), weight 52.6 kg, last menstrual period 10/10/2011, SpO2 99 %.Body mass index is 19.91 kg/m.  General Appearance:  Casual  Eye Contact:  Good  Speech:  Normal Rate  Volume:  Normal  Mood:  Reports improving mood  Affect:  Congruent and Vaguely anxious but fully reactive  Thought Process:  Linear and Descriptions of Associations: Intact  Orientation:  Full (Time, Place, and Person)  Thought Content:  No hallucinations, no delusions  Suicidal Thoughts:  No currently denies suicidal or self-injurious ideations, also denies any homicidal or violent ideations  Homicidal Thoughts:  No  Memory:  Recent and remote grossly intact  Judgement:  Fair/improving  Insight:  Fair  Psychomotor Activity:  Normal  Concentration:  Concentration: Good and Attention Span: Good  Recall:  Good  Fund of Knowledge:  Good  Language:  Good  Akathisia:  Negative  Handed:  Right  AIMS (if indicated):     Assets:  Desire for Improvement Resilience  ADL's:  Impaired  Cognition:  WNL  Sleep:  Number of Hours: 6.75    Assessment: 53 year old single female, currently homeless, presented voluntarily to ED reporting worsening depression, neuro-vegetative symptoms, demeaning/hypercritical intermittent auditory hallucinations, passive SI, following the death of her fiance three months ago. She has a history of Schizoaffective Disorder diagnosis. She also reports history of Cocaine Use Disorder and relapsed on Cocaine several weeks ago.  Patient reports gradual/partial improvement in mood compared to admission.  She does present with a fuller range of affect than when this writer last saw her last week.  Currently denies suicidal ideations and presents future oriented, specifically wanting to go to a rehab at discharge.  Tolerating medications well, and currently does not endorse medication side effects . she expresses interest in titrating Neurontin further, which she states has been helpful in addressing anxiety and pain. Treatment Plan Summary: Treatment plan reviewed as below today 6/23 Encourage group and milieu participation to  work on coping skills and symptom reduction Encourage efforts to work on sobriety/relapse prevention Treatment team working on disposition planning options.  1.Continuegabapentin to 447m p.o. 3 times daily for chronic pain and anxiety. 2. Continue Risperdal 1 mg p.o. nightly for psychosis. 3.ContinueSertraline to 75 mg p.o. daily for mood and anxiety. 4. Continue Trazodone 50 mg p.o. q. at bedtime as needed insomnia. 5. Continue Ventolin HFA and inhalation every 4 hours as needed wheezing. 6.ContinueMeloxicam 15 mg p.o. daily for osteoarthritic pain.  Amanda Campus, MD 06/07/2019, 3:30 PM   Patient ID: Rolla Plate, female   DOB: [birth year], 52 y.o.   MRN: [id number]

## 2019-06-08 DIAGNOSIS — F251 Schizoaffective disorder, depressive type: Secondary | ICD-10-CM

## 2019-06-08 NOTE — [facility] Group Notes (Signed)
Adult Psychoeducational Group Note  Date:  06/08/2019 Time:  9:30 AM  Group Topic/Focus:  Goals Group:   The focus of this group is to help patients establish daily goals to achieve during treatment and discuss how the patient can incorporate goal setting into their daily lives to aide in recovery.  Participation Level:  Active  Participation Quality:  Appropriate  Affect:  Appropriate  Cognitive:  Alert  Insight: Appropriate  Engagement in Group:  Engaged  Modes of Intervention:  Orientation  Additional Comments:  Pt attended and participated in orientation/goals group. Pt goal for today is to develop a positive mindset.   Amanda Keith 06/08/2019, 9:30 AM

## 2019-06-08 NOTE — Plan of Care (Signed)
Nurse discussed anxiety, depression and coping skills with patient.  

## 2019-06-08 NOTE — Progress Notes (Signed)
D:  Amanda Keith was in the day room much of the evening watching TV with peers.  She was pleasant and cooperative.  She reported her day good and she is looking forward to going to [facility] hopefully Friday.  She denied SI/HI or A/V hallucinations.  She denied any questions or issues at this time.  She took her hs medications without difficulty and did request trazodone for sleep.  She is currently resting with her eyes closed and appears to be asleep. A:  1:1 with RN for support and encouragement.  Medications as ordered.  Q 15 minute checks maintained for safety.  Encouraged participation in group and unit activities.   R:  Amanda Keith remains safe on the unit.  We will continue to monitor the progress towards her goals.

## 2019-06-08 NOTE — Plan of Care (Signed)
  Problem: Activity: Goal: Interest or engagement in leisure activities will improve Outcome: Progressing Goal: Imbalance in normal sleep/wake cycle will improve Outcome: Progressing   

## 2019-06-08 NOTE — [facility] Group Notes (Signed)
Occupational Therapy Group Note  Date:  06/08/2019 Time:  12:42 PM  Group Topic/Focus:  Self Esteem Action Plan:   The focus of this group is to help patients create a plan to continue to build self-esteem after discharge.  Participation Level:  Minimal  Participation Quality:  Drowsy  Affect:  Flat  Cognitive:  Appropriate  Insight: Improving  Engagement in Group:  Engaged  Modes of Intervention:  Activity, Discussion, Education and Socialization  Additional Comments:    S: none offered this date  O: OT tx with focus on self esteem building this date. Education given on definition of self esteem, with both causes of low and high self esteem identified. Activity given for pt to identify a positive/aspiring trait for each letter of the alphabet. Pt to work with peers to help complete activity and build positive thinking.   A: Pt presents with flat affect, drowsy and asleep for first half of session and not engaged in discussion. Pt did complete A-Z activity with min VC's.  P: OT group will be x1 per week while pt inpatient.  Zenovia Jarred, MSOT, OTR/L Behavioral Health OT/ Acute Relief OT PHP Office: [place] 06/08/2019, 12:42 PM

## 2019-06-08 NOTE — Care Management (Signed)
CMA spoke with Amy with Admissions at [facility]. Patient has been accepted for treatment on Friday, 6/26 at 2:00p.    CMA will notify, CSW, Jolan.     Sharryn Belding Care Management Assistant  Email:[email or web address] Office: [phone]

## 2019-06-08 NOTE — Tx Team (Signed)
Interdisciplinary Treatment and Diagnostic Plan Update  06/08/2019 Time of Session: 09:36am Britni Driscoll MRN: [id number]  Principal Diagnosis: <principal problem not specified>  Secondary Diagnoses: Active Problems:   Schizoaffective disorder (HCC)   Current Medications:  Current Facility-Administered Medications  Medication Dose Route Frequency Provider Last Rate Last Dose  . acetaminophen (TYLENOL) tablet 650 mg  650 mg Oral Q6H PRN Mordecai Maes, NP   650 mg at 06/06/19 1257  . albuterol (VENTOLIN HFA) 108 (90 Base) MCG/ACT inhaler 1-2 puff  1-2 puff Inhalation Q4H PRN Derrill Center, NP   2 puff at 06/02/19 1620  . alum & mag hydroxide-simeth (MAALOX/MYLANTA) 200-200-20 MG/5ML suspension 30 mL  30 mL Oral Q4H PRN Mordecai Maes, NP   30 mL at 06/07/19 2003  . feeding supplement (ENSURE ENLIVE) (ENSURE ENLIVE) liquid 237 mL  237 mL Oral BID BM Derrill Center, NP   237 mL at 06/08/19 0835  . gabapentin (NEURONTIN) capsule 400 mg  400 mg Oral TID Cobos, Myer Peer, MD   400 mg at 06/08/19 0834  . magnesium hydroxide (MILK OF MAGNESIA) suspension 30 mL  30 mL Oral Daily PRN Mordecai Maes, NP      . meloxicam (MOBIC) tablet 15 mg  15 mg Oral Daily Sharma Covert, MD   15 mg at 06/08/19 0835  . multivitamin with minerals tablet 1 tablet  1 tablet Oral Daily Cobos, Myer Peer, MD   1 tablet at 06/08/19 0835  . risperiDONE (RISPERDAL) tablet 1 mg  1 mg Oral QHS Cobos, Myer Peer, MD   1 mg at 06/07/19 2138  . sertraline (ZOLOFT) tablet 100 mg  100 mg Oral QHS Cobos, Myer Peer, MD   100 mg at 06/07/19 2138  . traZODone (DESYREL) tablet 50 mg  50 mg Oral QHS PRN Cobos, Myer Peer, MD   50 mg at 06/07/19 2138   PTA Medications: Medications Prior to Admission  Medication Sig Dispense Refill Last Dose  . acetaminophen (TYLENOL) 325 MG tablet Take 650 mg by mouth every 6 (six) hours as needed for mild pain or headache.     . albuterol (PROVENTIL HFA;VENTOLIN HFA) 108 (90 Base)  MCG/ACT inhaler Inhale 1-2 puffs into the lungs every 6 (six) hours as needed for wheezing or shortness of breath. (Patient not taking: Reported on 05/27/2019) 18 g 0   . budesonide-formoterol (SYMBICORT) 80-4.5 MCG/ACT inhaler Inhale 2 puffs into the lungs 2 (two) times daily as needed (for asthma flares). (Patient not taking: Reported on 05/27/2019) 1 Inhaler 0   . cyclobenzaprine (FLEXERIL) 10 MG tablet Take 1 tablet (10 mg total) by mouth 2 (two) times daily as needed for muscle spasms. (Patient not taking: Reported on 12/30/2018) 20 tablet 0   . furosemide (LASIX) 20 MG tablet Take one tablet daily as needed for leg swelling. (Patient not taking: Reported on 12/30/2018) 10 tablet 0   . methocarbamol (ROBAXIN) 500 MG tablet Take 1 tablet (500 mg total) by mouth 2 (two) times daily. (Patient not taking: Reported on 12/30/2018) 10 tablet 0   . naproxen (NAPROSYN) 375 MG tablet Take 1 tablet (375 mg total) by mouth 2 (two) times daily. (Patient not taking: Reported on 12/30/2018) 20 tablet 0   . predniSONE (DELTASONE) 20 MG tablet 3 tabs po day one, then 2 tabs daily x 4 days (Patient not taking: Reported on 05/27/2019) 11 tablet 0     Patient Stressors: Financial difficulties Loss of fiance Substance abuse Other: homeless  Patient Strengths: Average  or above average intelligence Capable of independent living Communication skills Motivation for treatment/growth  Treatment Modalities: Medication Management, Group therapy, Case management,  1 to 1 session with clinician, Psychoeducation, Recreational therapy.   Physician Treatment Plan for Primary Diagnosis: <principal problem not specified> Long Term Goal(s): Improvement in symptoms so as ready for discharge Improvement in symptoms so as ready for discharge   Short Term Goals: Ability to identify changes in lifestyle to reduce recurrence of condition will improve Ability to verbalize feelings will improve Ability to disclose and discuss  suicidal ideas Ability to demonstrate self-control will improve Ability to identify and develop effective coping behaviors will improve Ability to maintain clinical measurements within normal limits will improve Ability to identify changes in lifestyle to reduce recurrence of condition will improve Ability to identify triggers associated with substance abuse/mental health issues will improve  Medication Management: Evaluate patient's response, side effects, and tolerance of medication regimen.  Therapeutic Interventions: 1 to 1 sessions, Unit Group sessions and Medication administration.  Evaluation of Outcomes: Progressing  Physician Treatment Plan for Secondary Diagnosis: Active Problems:   Schizoaffective disorder (HCC)  Long Term Goal(s): Improvement in symptoms so as ready for discharge Improvement in symptoms so as ready for discharge   Short Term Goals: Ability to identify changes in lifestyle to reduce recurrence of condition will improve Ability to verbalize feelings will improve Ability to disclose and discuss suicidal ideas Ability to demonstrate self-control will improve Ability to identify and develop effective coping behaviors will improve Ability to maintain clinical measurements within normal limits will improve Ability to identify changes in lifestyle to reduce recurrence of condition will improve Ability to identify triggers associated with substance abuse/mental health issues will improve     Medication Management: Evaluate patient's response, side effects, and tolerance of medication regimen.  Therapeutic Interventions: 1 to 1 sessions, Unit Group sessions and Medication administration.  Evaluation of Outcomes: Progressing   RN Treatment Plan for Primary Diagnosis: <principal problem not specified> Long Term Goal(s): Knowledge of disease and therapeutic regimen to maintain health will improve  Short Term Goals: Ability to participate in decision making will  improve, Ability to verbalize feelings will improve, Ability to disclose and discuss suicidal ideas, Ability to identify and develop effective coping behaviors will improve and Compliance with prescribed medications will improve  Medication Management: RN will administer medications as ordered by provider, will assess and evaluate patient's response and provide education to patient for prescribed medication. RN will report any adverse and/or side effects to prescribing provider.  Therapeutic Interventions: 1 on 1 counseling sessions, Psychoeducation, Medication administration, Evaluate responses to treatment, Monitor vital signs and CBGs as ordered, Perform/monitor CIWA, COWS, AIMS and Fall Risk screenings as ordered, Perform wound care treatments as ordered.  Evaluation of Outcomes: Progressing   LCSW Treatment Plan for Primary Diagnosis: <principal problem not specified> Long Term Goal(s): Safe transition to appropriate next level of care at discharge, Engage patient in therapeutic group addressing interpersonal concerns.  Short Term Goals: Engage patient in aftercare planning with referrals and resources, Identify triggers associated with mental health/substance abuse issues and Increase skills for wellness and recovery  Therapeutic Interventions: Assess for all discharge needs, 1 to 1 time with Social worker, Explore available resources and support systems, Assess for adequacy in community support network, Educate family and significant other(s) on suicide prevention, Complete Psychosocial Assessment, Interpersonal group therapy.  Evaluation of Outcomes: Progressing   Progress in Treatment: Attending groups: Yes. Participating in groups: Yes. Taking medication as prescribed: Yes.  Toleration medication: Yes. Family/Significant other contact made: Yes, individual(s) contacted:  deceased fiance's sister Patient understands diagnosis: Yes. Discussing patient identified problems/goals with  staff: Yes. Medical problems stabilized or resolved: Yes. Denies suicidal/homicidal ideation: Yes. Issues/concerns per patient self-inventory: No. Other:   New problem(s) identified: No, Describe:  None  New Short Term/Long Term Goal(s): Medication stabilization, elimination of SI thoughts, and development of a comprehensive mental wellness plan.   Patient Goals:  "Get back on my meds and work on my emotional stuff"  Discharge Plan or Barriers: CSW will continue to follow up for appropriate referrals and possible discharge planning. [facility], her referrals are pending  Reason for Continuation of Hospitalization: Anxiety Depression Medication stabilization  Estimated Length of Stay: 3-5 days  Attendees: Patient: 05/30/2019   Physician: Dr. Nehemiah MassedFernando Cobos, MD 05/30/2019   Nursing: Lanora ManisElizabeth, RN; ByramBeverly.Kirtland BouchardK, RN 05/30/2019   RN Care Manager: 05/30/2019   Social Worker: Stephannie PetersJasmine Davis, LCSW [place], [place]; [place] Four[place], [place] 05/30/2019   Recreational Therapist:  05/30/2019   Other:  05/30/2019   Other:  05/30/2019   Other: 05/30/2019     Scribe for Treatment Team: Maeola SarahJolan E Alayzha An, LCSWA 06/08/2019 9:55 AM

## 2019-06-08 NOTE — Progress Notes (Addendum)
[facility]  06/08/2019 10:04 AM Amanda Keith  MRN:  [id number] Subjective:  "My ankle feels better."  Amanda Keith found socializing in the dayroom. She received a brace for her right ankle yesterday and reports chronic pain is improved with brace. She is ambulating well. She did have some tearfulness yesterday related to grieving the recent loss of her fiance. Overall she reports improving mood with current medications. Denies SI. She reports that she did well on Zoloft in the past and feels it will be helpful. She denies AVH. She has been interacting appropriately on the unit. No agitated or disruptive behaviors. She reports sleep is improved as well. Denies medication side effects. No withdrawal symptoms. She appears future-oriented. She is planning to discharge to rehab. She has been accepted by [facility] for admission on Friday.    From admission H&P: 53 year old female, presented to ED voluntarily on 6/12, reporting worsening depression. States she has been depressed since her fiance died in April 2020  from pancreatic cancer. States that prior to that " I was in rehab, taking my medications, and I was feeling better". After his death she has been feeling severely depressed, and reports thoughts of " not wanting to be alive because he is gone". She reports suicidal ideations, without specific plan or intention. Endorses neuro-vegetative symptoms as below, also endorses intermittent auditory hallucinations. Reports she has been using crack cocaine daily over recent weeks.  Principal Problem: <principal problem not specified> Diagnosis: Active Problems:   Schizoaffective disorder (HCC)  Total Time spent with patient: 15 minutes  Past Psychiatric History: See admission H&P  Past Medical History:  Past Medical History:  Diagnosis Date  . Abuse, drug or alcohol (HCC)   . Anxiety   . Asthma   . Schizo affective schizophrenia (HCC)   . Scoliosis     Past Surgical History:  Procedure  Laterality Date  . ABDOMINAL HYSTERECTOMY    . CESAREAN SECTION     Family History: History reviewed. No pertinent family history. Family Psychiatric  History: See admission H&P Social History:  Social History   Substance and Sexual Activity  Alcohol Use No     Social History   Substance and Sexual Activity  Drug Use Yes  . Types: Cocaine   Comment: clean x 1 month    Social History   Socioeconomic History  . Marital status: Single    Spouse name: Not on file  . Number of children: Not on file  . Years of education: Not on file  . Highest education level: Not on file  Occupational History  . Not on file  Social Needs  . Financial resource strain: Not on file  . Food insecurity    Worry: Not on file    Inability: Not on file  . Transportation needs    Medical: Not on file    Non-medical: Not on file  Tobacco Use  . Smoking status: Never Smoker  . Smokeless tobacco: Never Used  Substance and Sexual Activity  . Alcohol use: No  . Drug use: Yes    Types: Cocaine    Comment: clean x 1 month  . Sexual activity: Not on file    Comment: crack  Lifestyle  . Physical activity    Days per week: Not on file    Minutes per session: Not on file  . Stress: Not on file  Relationships  . Social connections    Talks on phone: Not on file  Gets together: Not on file    Attends religious service: Not on file    Active member of club or organization: Not on file    Attends meetings of clubs or organizations: Not on file    Relationship status: Not on file  Other Topics Concern  . Not on file  Social History Narrative  . Not on file   Additional Social History:                         Sleep: Good  Appetite:  Fair  Current Medications: Current Facility-Administered Medications  Medication Dose Route Frequency Provider Last Rate Last Dose  . acetaminophen (TYLENOL) tablet 650 mg  650 mg Oral Q6H PRN [name], [name], NP   650 mg at 06/06/19 1257  .  albuterol (VENTOLIN HFA) 108 (90 Base) MCG/ACT inhaler 1-2 puff  1-2 puff Inhalation Q4H PRN [name], Tanika N, NP   2 puff at 06/02/19 1620  . alum & mag hydroxide-simeth (MAALOX/MYLANTA) 200-200-20 MG/5ML suspension 30 mL  30 mL Oral Q4H PRN [name], [name], NP   30 mL at 06/07/19 2003  . feeding supplement (ENSURE ENLIVE) (ENSURE ENLIVE) liquid 237 mL  237 mL Oral BID BM [name], Tanika N, NP   237 mL at 06/08/19 0835  . gabapentin (NEURONTIN) capsule 400 mg  400 mg Oral TID Arpi Diebold, Rockey SituFernando A, MD   400 mg at 06/08/19 0834  . magnesium hydroxide (MILK OF MAGNESIA) suspension 30 mL  30 mL Oral Daily PRN [name], [name], NP      . meloxicam (MOBIC) tablet 15 mg  15 mg Oral Daily Antonieta Pertlary, [name], MD   15 mg at 06/08/19 0835  . multivitamin with minerals tablet 1 tablet  1 tablet Oral Daily [name], Rockey SituFernando A, MD   1 tablet at 06/08/19 0835  . risperiDONE (RISPERDAL) tablet 1 mg  1 mg Oral QHS [name], Rockey SituFernando A, MD   1 mg at 06/07/19 2138  . sertraline (ZOLOFT) tablet 100 mg  100 mg Oral QHS [name], Rockey SituFernando A, MD   100 mg at 06/07/19 2138  . traZODone (DESYREL) tablet 50 mg  50 mg Oral QHS PRN [name], Rockey SituFernando A, MD   50 mg at 06/07/19 2138    Lab Results: No results found for this or any previous visit (from the past 48 hour(s)).  Blood Alcohol level:  Lab Results  Component Value Date   ETH <10 05/27/2019   ETH <10 01/20/2019    Metabolic Disorder Labs: Lab Results  Component Value Date   HGBA1C 5.7 (H) 05/30/2019   MPG 116.89 05/30/2019   MPG 116.89 05/29/2019   Lab Results  Component Value Date   PROLACTIN 27.1 (H) 05/29/2019   Lab Results  Component Value Date   CHOL 180 05/30/2019   TRIG 110 05/30/2019   HDL 46 05/30/2019   CHOLHDL 3.9 05/30/2019   VLDL 22 05/30/2019   LDLCALC 112 (H) 05/30/2019   LDLCALC 117 (H) 05/29/2019    Physical Findings: AIMS: Facial and Oral Movements Muscles of Facial Expression: None, normal Lips and Perioral Area: None, normal Jaw: None,  normal Tongue: None, normal,Extremity Movements Upper (arms, wrists, hands, fingers): None, normal Lower (legs, knees, ankles, toes): None, normal, Trunk Movements Neck, shoulders, hips: None, normal, Overall Severity Severity of abnormal movements (highest score from questions above): None, normal Incapacitation due to abnormal movements: None, normal Patient's awareness of abnormal movements (rate only patient's report): No Awareness, Dental Status Current problems  with teeth and/or dentures?: No Does patient usually wear dentures?: No  CIWA:  CIWA-Ar Total: 1 COWS:  COWS Total Score: 2  Musculoskeletal: Strength & Muscle Tone: within normal limits Gait & Station: normal Patient leans: N/A  Psychiatric Specialty Exam: Physical Exam  Nursing note and vitals reviewed. Constitutional: She is oriented to person, place, and time. She appears well-developed and well-nourished.  Cardiovascular: Normal rate.  Respiratory: Effort normal.  Neurological: She is alert and oriented to person, place, and time.    Review of Systems  Constitutional: Negative.   Respiratory: Negative for cough and shortness of breath.   Cardiovascular: Negative for chest pain.  Psychiatric/Behavioral: Positive for depression (improving) and substance abuse. Negative for hallucinations and suicidal ideas. The patient is not nervous/anxious and does not have insomnia.     Blood pressure 102/74, pulse 66, temperature 98 F (36.7 C), temperature source Oral, resp. rate 20, height 5\' 4"  (1.626 m), weight 52.6 kg, last menstrual period 10/10/2011, SpO2 100 %.Body mass index is 19.91 kg/m.  General Appearance: Casual  Eye Contact:  Good  Speech:  Clear and Coherent and Normal Rate  Volume:  Normal  Mood:  Euthymic  Affect:  Appropriate and Congruent  Thought Process:  Coherent  Orientation:  Full (Time, Place, and Person)  Thought Content:  Logical  Suicidal Thoughts:  No  Homicidal Thoughts:  No  Memory:   Immediate;   Fair Recent;   Fair  Judgement:  Intact  Insight:  Fair  Psychomotor Activity:  Normal  Concentration:  Concentration: Good and Attention Span: Good  Recall:  [employer] of Knowledge:  Fair  Language:  Good  Akathisia:  No  Handed:  Right  AIMS (if indicated):     Assets:  Communication Skills Desire for Improvement Leisure Time Resilience  ADL's:  Intact  Cognition:  WNL  Sleep:  Number of Hours: 5.25     Treatment Plan Summary: Daily contact with patient to assess and evaluate symptoms and progress in treatment and Medication management   Continue inpatient hospitalization.  Continue Zoloft 100 mg PO daily for mood Continue Risperdal 1 mg PO QHS for psychosis Continue Neurontin 400 mg PO TID for chronic pain Continue Mobic 15 mg PO daily for pain Continue trazodone 50 mg PO QHS PRN insomnia  Patient will participate in the therapeutic group milieu.  Discharge disposition in progress.   Connye Burkitt, NP 06/08/2019, 10:04 AM   Agree with NP Progress Note

## 2019-06-08 NOTE — Progress Notes (Addendum)
D:  Patient's self inventory sheet, patient sleeps good, sleep medication helpful.  Fair appetite, normal energy level, good concentration.  Rated depression and hopeless 4.  Denied withdrawals.  Denied SI.  Denied physical problems.  Denied physical pain.  Pain medication helpful.  Goal is develop a new mindset.  Plans to meditate, practice it.  Does have discharge plans. A:  Medications administered per MD orders.  Emotional support and encouragement given patient. R:  Denied SI and HI, contracts for safety.  Denied A/V hallucinations.  Safety maintained with 15 minute checks. Patient did not use her walker this morning.

## 2019-06-08 NOTE — Progress Notes (Signed)
D: Pt denies SI/HI/AVH. Pt is pleasant and cooperative. Pt stated she was doing ok A: Pt was offered support and encouragement. Pt was given scheduled medications. Pt was encourage to attend groups. Q 15 minute checks were done for safety.  R:Pt attends groups and interacts well with peers and staff. Pt is taking medication. Pt has no complaints.Pt receptive to treatment and safety maintained on unit. 

## 2019-06-09 MED ORDER — LIDOCAINE 5 % EX PTCH
1.0000 | MEDICATED_PATCH | Freq: Every evening | CUTANEOUS | Status: DC | PRN
  Administered 2019-06-09: 1 via TRANSDERMAL
  Filled 2019-06-09: qty 1

## 2019-06-09 NOTE — Progress Notes (Addendum)
[facility]  06/09/2019 12:03 PM Amanda Keith  MRN:  [id number] Subjective:  "I feel a lot better."  Amanda Keith found sitting in the dayroom. She reports good mood and presents with bright affect, future-oriented, looking forward to discharge to [facility] tomorrow. She reports good sleep and appetite. She has been interacting appropriately on the unit. She states medication regimen has helped stabilize her mood so that she can participate in group therapy and process some of the grief from losing her ex-fiance. She had been repressing her grief and using cocaine recently to cope. She reports optimism about continuing treatment at [facility]. Ankle pain has improved with wrap. She report back pain from sciatica, particularly overnight and requests lidocaine patches, which have previously been helpful for her back pain. Denies SI/HI/AVH.  From admission H&P: 53 year old female, presented to ED voluntarily on 6/12, reporting worsening depression. States she has been depressed since her fiance died in April 2020 from pancreatic cancer. States that prior to that " I was in rehab, taking my medications, and I was feeling better". After his death she has been feeling severely depressed, and reports thoughts of " not wanting to be alive because he is gone". She reports suicidal ideations, without specific plan or intention. Endorses neuro-vegetative symptoms as below, also endorses intermittent auditory hallucinations. Reports she has been using crack cocaine daily over recent weeks.  Principal Problem: <principal problem not specified> Diagnosis: Active Problems:   Schizoaffective disorder (HCC)  Total Time spent with patient: 15 minutes  Past Psychiatric History: See admission H&P  Past Medical History:  Past Medical History:  Diagnosis Date  . Abuse, drug or alcohol (HCC)   . Anxiety   . Asthma   . Schizo affective schizophrenia (HCC)   . Scoliosis     Past Surgical History:  Procedure  Laterality Date  . ABDOMINAL HYSTERECTOMY    . CESAREAN SECTION     Family History: History reviewed. No pertinent family history. Family Psychiatric  History: See admission H&P Social History:  Social History   Substance and Sexual Activity  Alcohol Use No     Social History   Substance and Sexual Activity  Drug Use Yes  . Types: Cocaine   Comment: clean x 1 month    Social History   Socioeconomic History  . Marital status: Single    Spouse name: Not on file  . Number of children: Not on file  . Years of education: Not on file  . Highest education level: Not on file  Occupational History  . Not on file  Social Needs  . Financial resource strain: Not on file  . Food insecurity    Worry: Not on file    Inability: Not on file  . Transportation needs    Medical: Not on file    Non-medical: Not on file  Tobacco Use  . Smoking status: Never Smoker  . Smokeless tobacco: Never Used  Substance and Sexual Activity  . Alcohol use: No  . Drug use: Yes    Types: Cocaine    Comment: clean x 1 month  . Sexual activity: Not on file    Comment: crack  Lifestyle  . Physical activity    Days per week: Not on file    Minutes per session: Not on file  . Stress: Not on file  Relationships  . Social Musicianconnections    Talks on phone: Not on file    Gets together: Not on file  Attends religious service: Not on file    Active member of club or organization: Not on file    Attends meetings of clubs or organizations: Not on file    Relationship status: Not on file  Other Topics Concern  . Not on file  Social History Narrative  . Not on file   Additional Social History:                         Sleep: Good  Appetite:  Good  Current Medications: Current Facility-Administered Medications  Medication Dose Route Frequency Provider Last Rate Last Dose  . acetaminophen (TYLENOL) tablet 650 mg  650 mg Oral Q6H PRN [name], [name], NP   650 mg at 06/06/19 1257  .  albuterol (VENTOLIN HFA) 108 (90 Base) MCG/ACT inhaler 1-2 puff  1-2 puff Inhalation Q4H PRN [name], Tanika N, NP   2 puff at 06/08/19 1041  . alum & mag hydroxide-simeth (MAALOX/MYLANTA) 200-200-20 MG/5ML suspension 30 mL  30 mL Oral Q4H PRN [name], [name], NP   30 mL at 06/07/19 2003  . feeding supplement (ENSURE ENLIVE) (ENSURE ENLIVE) liquid 237 mL  237 mL Oral BID BM [name], Tanika N, NP   237 mL at 06/08/19 0835  . gabapentin (NEURONTIN) capsule 400 mg  400 mg Oral TID Cobos, Rockey SituFernando A, MD   400 mg at 06/09/19 0830  . magnesium hydroxide (MILK OF MAGNESIA) suspension 30 mL  30 mL Oral Daily PRN [name], [name], NP      . meloxicam (MOBIC) tablet 15 mg  15 mg Oral Daily Antonieta Pertlary, [name], MD   15 mg at 06/09/19 0829  . multivitamin with minerals tablet 1 tablet  1 tablet Oral Daily Cobos, Rockey SituFernando A, MD   1 tablet at 06/09/19 0830  . risperiDONE (RISPERDAL) tablet 1 mg  1 mg Oral QHS Cobos, Rockey SituFernando A, MD   1 mg at 06/08/19 2149  . sertraline (ZOLOFT) tablet 100 mg  100 mg Oral QHS Cobos, Rockey SituFernando A, MD   100 mg at 06/08/19 2149  . traZODone (DESYREL) tablet 50 mg  50 mg Oral QHS PRN Cobos, Rockey SituFernando A, MD   50 mg at 06/08/19 2149    Lab Results: No results found for this or any previous visit (from the past 48 hour(s)).  Blood Alcohol level:  Lab Results  Component Value Date   ETH <10 05/27/2019   ETH <10 01/20/2019    Metabolic Disorder Labs: Lab Results  Component Value Date   HGBA1C 5.7 (H) 05/30/2019   MPG 116.89 05/30/2019   MPG 116.89 05/29/2019   Lab Results  Component Value Date   PROLACTIN 27.1 (H) 05/29/2019   Lab Results  Component Value Date   CHOL 180 05/30/2019   TRIG 110 05/30/2019   HDL 46 05/30/2019   CHOLHDL 3.9 05/30/2019   VLDL 22 05/30/2019   LDLCALC 112 (H) 05/30/2019   LDLCALC 117 (H) 05/29/2019    Physical Findings: AIMS: Facial and Oral Movements Muscles of Facial Expression: None, normal Lips and Perioral Area: None, normal Jaw: None,  normal Tongue: None, normal,Extremity Movements Upper (arms, wrists, hands, fingers): None, normal Lower (legs, knees, ankles, toes): None, normal, Trunk Movements Neck, shoulders, hips: None, normal, Overall Severity Severity of abnormal movements (highest score from questions above): None, normal Incapacitation due to abnormal movements: None, normal Patient's awareness of abnormal movements (rate only patient's report): No Awareness, Dental Status Current problems with teeth and/or dentures?: No Does patient usually  wear dentures?: No  CIWA:  CIWA-Ar Total: 1 COWS:  COWS Total Score: 1  Musculoskeletal: Strength & Muscle Tone: within normal limits Gait & Station: normal Patient leans: N/A  Psychiatric Specialty Exam: Physical Exam  Nursing note and vitals reviewed. Constitutional: She is oriented to person, place, and time. She appears well-developed and well-nourished.  Cardiovascular: Normal rate.  Respiratory: Effort normal.  Neurological: She is alert and oriented to person, place, and time.    Review of Systems  Constitutional: Negative.   Respiratory: Negative for cough and shortness of breath.   Cardiovascular: Negative for chest pain.  Gastrointestinal: Negative for diarrhea, nausea and vomiting.  Neurological: Negative for headaches.  Psychiatric/Behavioral: Positive for substance abuse. Negative for depression, hallucinations and suicidal ideas. The patient is not nervous/anxious and does not have insomnia.     Blood pressure 99/70, pulse 68, temperature 97.7 F (36.5 C), temperature source Oral, resp. rate 16, height 5\' 4"  (1.626 m), weight 52.6 kg, last menstrual period 10/10/2011, SpO2 100 %.Body mass index is 19.91 kg/m.  General Appearance: Fairly Groomed  Eye Contact:  Good  Speech:  Clear and Coherent and Normal Rate  Volume:  Normal  Mood:  Euthymic  Affect:  Appropriate and Congruent  Thought Process:  Coherent and Goal Directed  Orientation:  Full  (Time, Place, and Person)  Thought Content:  Logical  Suicidal Thoughts:  No  Homicidal Thoughts:  No  Memory:  Immediate;   Good Recent;   Good  Judgement:  Intact  Insight:  Good  Psychomotor Activity:  Normal  Concentration:  Concentration: Good and Attention Span: Good  Recall:  Good  Fund of Knowledge:  Fair  Language:  Good  Akathisia:  No  Handed:  Right  AIMS (if indicated):     Assets:  Communication Skills Desire for Improvement Resilience  ADL's:  Intact  Cognition:  WNL  Sleep:  Number of Hours: 6.5     Treatment Plan Summary: Daily contact with patient to assess and evaluate symptoms and progress in treatment and Medication management   Continue inpatient hospitalization- discharging to [facility] tomorrow 06/10/19.  Continue Zoloft 100 mg PO daily for mood Continue Risperdal 1 mg PO QHS for psychosis Continue Neurontin 400 mg PO TID for chronic pain Continue Mobic 15 mg PO daily for pain Continue trazodone 50 mg PO QHS PRN insomnia Start lidocaine patches transdermal QHS PRN back pain  Patient will participate in the therapeutic group milieu.  Discharge disposition in progress.   Connye Burkitt, NP 06/09/2019, 12:03 PM   Agree with NP Progress Note

## 2019-06-09 NOTE — Progress Notes (Signed)
Patient ID: Amanda Keith, female   DOB: 07/04/1966, 53 y.o.   MRN: [id number] Pt has had a flat affect with brief eye contact. Cooperative and pleasant on approach. Pt has been positive for unit activities with minimal prompting. Active in the milieu. Pt primary c/o ankle pain which was relieved with medications. Pt denies s.i. or avh. Support and encouragement provided. Med ed reinforced. Pt cooperative.

## 2019-06-10 DIAGNOSIS — F1424 Cocaine dependence with cocaine-induced mood disorder: Secondary | ICD-10-CM

## 2019-06-10 MED ORDER — MELOXICAM 15 MG PO TABS: 15.0000 mg | ORAL_TABLET | Freq: Every day | ORAL | 0 refills | Status: AC

## 2019-06-10 MED ORDER — LIDOCAINE 5 % EX PTCH: 1.0000 | MEDICATED_PATCH | Freq: Every evening | CUTANEOUS | 0 refills | Status: AC | PRN

## 2019-06-10 MED ORDER — ALBUTEROL SULFATE HFA 108 (90 BASE) MCG/ACT IN AERS: 1.0000 | INHALATION_SPRAY | RESPIRATORY_TRACT | 0 refills | Status: AC | PRN

## 2019-06-10 MED ORDER — SERTRALINE HCL 100 MG PO TABS: 100.0000 mg | ORAL_TABLET | Freq: Every day | ORAL | 0 refills | Status: AC

## 2019-06-10 MED ORDER — GABAPENTIN 400 MG PO CAPS: 400.0000 mg | ORAL_CAPSULE | Freq: Three times a day (TID) | ORAL | 0 refills | Status: AC

## 2019-06-10 MED ORDER — TRAZODONE HCL 50 MG PO TABS: 50.0000 mg | ORAL_TABLET | Freq: Every evening | ORAL | 0 refills | Status: AC | PRN

## 2019-06-10 MED ORDER — RISPERIDONE 1 MG PO TABS: 1.0000 mg | ORAL_TABLET | Freq: Every day | ORAL | 0 refills | Status: AC

## 2019-06-10 MED ORDER — ADULT MULTIVITAMIN W/MINERALS CH: 1.0000 | ORAL_TABLET | Freq: Every day | ORAL | Status: AC

## 2019-06-10 NOTE — Progress Notes (Signed)
[place] NOVEL CORONAVIRUS (COVID-19) DAILY CHECK-OFF SYMPTOMS - answer yes or no to each - every day NO YES  Have you had a fever in the past 24 hours?  . Fever (Temp > 37.80C / 100F) X   Have you had any of these symptoms in the past 24 hours? . New Cough .  Sore Throat  .  Shortness of Breath .  Difficulty Breathing .  Unexplained Body Aches   X   Have you had any one of these symptoms in the past 24 hours not related to allergies?   . Runny Nose .  Nasal Congestion .  Sneezing   X   If you have had runny nose, nasal congestion, sneezing in the past 24 hours, has it worsened?  X   EXPOSURES - check yes or no X   Have you traveled outside the state in the past 14 days?  X   Have you been in contact with someone with a confirmed diagnosis of COVID-19 or PUI in the past 14 days without wearing appropriate PPE?  X   Have you been living in the same home as a person with confirmed diagnosis of COVID-19 or a PUI (household contact)?    X   Have you been diagnosed with COVID-19?    X              What to do next: Answered NO to all: Answered YES to anything:   Proceed with unit schedule Follow the [facility] Inpatient Flowsheet.   
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
## 2019-06-10 NOTE — Plan of Care (Signed)
?  Problem: Education: ?Goal: Knowledge of [facility] Education information/materials will improve ?Outcome: Adequate for Discharge ?Goal: Emotional status will improve ?Outcome: Adequate for Discharge ?Goal: Mental status will improve ?Outcome: Adequate for Discharge ?Goal: Verbalization of understanding the information provided will improve ?Outcome: Adequate for Discharge ?  ?Problem: Activity: ?Goal: Interest or engagement in activities will improve ?Outcome: Adequate for Discharge ?Goal: Sleeping patterns will improve ?Outcome: Adequate for Discharge ?  ?Problem: Coping: ?Goal: Ability to verbalize frustrations and anger appropriately will improve ?Outcome: Adequate for Discharge ?Goal: Ability to demonstrate self-control will improve ?Outcome: Adequate for Discharge ?  ?Problem: Health Behavior/Discharge Planning: ?Goal: Identification of resources available to assist in meeting health care needs will improve ?Outcome: Adequate for Discharge ?Goal: Compliance with treatment plan for underlying cause of condition will improve ?Outcome: Adequate for Discharge ?  ?Problem: Physical Regulation: ?Goal: Ability to maintain clinical measurements within normal limits will improve ?Outcome: Adequate for Discharge ?  ?Problem: Safety: ?Goal: Periods of time without injury will increase ?Outcome: Adequate for Discharge ?  ?Problem: Education: ?Goal: Utilization of techniques to improve thought processes will improve ?Outcome: Adequate for Discharge ?Goal: Knowledge of the prescribed therapeutic regimen will improve ?Outcome: Adequate for Discharge ?  ?Problem: Activity: ?Goal: Interest or engagement in leisure activities will improve ?Outcome: Adequate for Discharge ?Goal: Imbalance in normal sleep/wake cycle will improve ?Outcome: Adequate for Discharge ?  ?Problem: Coping: ?Goal: Coping ability will improve ?Outcome: Adequate for Discharge ?Goal: Will verbalize feelings ?Outcome: Adequate for Discharge ?   ?Problem: Health Behavior/Discharge Planning: ?Goal: Ability to make decisions will improve ?Outcome: Adequate for Discharge ?Goal: Compliance with therapeutic regimen will improve ?Outcome: Adequate for Discharge ?  ?Problem: Role Relationship: ?Goal: Will demonstrate positive changes in social behaviors and relationships ?Outcome: Adequate for Discharge ?  ?Problem: Safety: ?Goal: Ability to disclose and discuss suicidal ideas will improve ?Outcome: Adequate for Discharge ?Goal: Ability to identify and utilize support systems that promote safety will improve ?Outcome: Adequate for Discharge ?  ?Problem: Self-Concept: ?Goal: Will verbalize positive feelings about self ?Outcome: Adequate for Discharge ?Goal: Level of anxiety will decrease ?Outcome: Adequate for Discharge ?  ?Problem: Education: ?Goal: Ability to make informed decisions regarding treatment will improve ?Outcome: Adequate for Discharge ?  ?Problem: Coping: ?Goal: Coping ability will improve ?Outcome: Adequate for Discharge ?  ?Problem: Health Behavior/Discharge Planning: ?Goal: Identification of resources available to assist in meeting health care needs will improve ?Outcome: Adequate for Discharge ?  ?Problem: Medication: ?Goal: Compliance with prescribed medication regimen will improve ?Outcome: Adequate for Discharge ?  ?Problem: Self-Concept: ?Goal: Ability to disclose and discuss suicidal ideas will improve ?Outcome: Adequate for Discharge ?Goal: Will verbalize positive feelings about self ?Outcome: Adequate for Discharge ?  ?

## 2019-06-10 NOTE — Progress Notes (Signed)
Pt completed her daily assessment and on this she wrote she denied having SI today and she rated her depression, hopelessness and anxiety " 4/4/4/", respectively. She was given her dc instrucitons by this Probation officer, they were discusssed and reviewed with her and she stated verbal understanding and willingness to comply. Her belongings were returned to her . She requested Engineer, technical sales ( I di9d) and DON ( [place] RN) ( I did) because she stated she is missing a bag of personal belongigns that is not in her locker- nor with her other belongigns that were documented as inventoried upon her admission. Per direction of security Psychologist, prison and probation services), Security Dir not here today. He will notify security dir on MOn am and request director roll back tape and see on aptient's admssion what she actually brought into this building. Patient states she has no cell phone and she was given this facility's phone number and she will call security at this facility on Monday- after 1100 per security- to inquire on f/u. Pt escorted to bldg entrnace and got into black [employer]- per LCSW JW ) this is lift transportation she has arranged for pt to be transported to [facility]> Pt dc'd per MD order.

## 2019-06-10 NOTE — Progress Notes (Signed)
  [facility] Adult Case Management Discharge Plan :  Will you be returning to the same living situation after discharge:  No. Patient is discharging to [place] for continuity of care At discharge, do you have transportation home?: Yes,  [employer]/Lyft transport Do you have the ability to pay for your medications: No.  Release of information consent forms completed and in the chart;  Patient's signature needed at discharge.  Patient to Follow up at: [facility] Follow up on 06/10/2019.   Why: You have been accepted for treatment on Friday, 6/26 at 2:00p. Please bring your photo ID, clothing, closed toe shoes, and money.  Contact information: [place] [address] [phone]           Next level of care provider has access to [place] and Suicide Prevention discussed: Yes,  with the patient's family member     Has patient been referred to the Quitline?: N/A patient is not a smoker  Patient has been referred for addiction treatment: N/A  Marylee Floras, [place] 06/10/2019, 12:12 PM

## 2019-06-10 NOTE — Progress Notes (Signed)
Pt affect flat, mood depressed, cooperative with staff and peers. Pt visible in dayroom interacting with mainly one peer off to corner of dayroom. Pt rated her day a "10" and her goal is to work on her discharge. Pt given lidocaine patch and trazodone for sleep. Pt denies SI/HI or hallucinations (a) 15 min checks (r) safety maintained.

## 2019-06-10 NOTE — Progress Notes (Signed)
[place] NOVEL CORONAVIRUS (COVID-19) DAILY CHECK-OFF SYMPTOMS - answer yes or no to each - every day NO YES  Have you had a fever in the past 24 hours?  . Fever (Temp > 37.80C / 100F) X   Have you had any of these symptoms in the past 24 hours? . New Cough .  Sore Throat  .  Shortness of Breath .  Difficulty Breathing .  Unexplained Body Aches   X   Have you had any one of these symptoms in the past 24 hours not related to allergies?   . Runny Nose .  Nasal Congestion .  Sneezing   X   If you have had runny nose, nasal congestion, sneezing in the past 24 hours, has it worsened?  X   EXPOSURES - check yes or no X   Have you traveled outside the state in the past 14 days?  X   Have you been in contact with someone with a confirmed diagnosis of COVID-19 or PUI in the past 14 days without wearing appropriate PPE?  X   Have you been living in the same home as a person with confirmed diagnosis of COVID-19 or a PUI (household contact)?    X   Have you been diagnosed with COVID-19?    X              What to do next: Answered NO to all: Answered YES to anything:   Proceed with unit schedule Follow the [facility] Inpatient Flowsheet.   

## 2019-06-10 NOTE — [facility] Suicide Risk Assessment (Signed)
[facility] Discharge Suicide Risk Assessment   Principal Problem: Depression, Substance Abuse  Discharge Diagnoses: Active Problems:   Schizoaffective disorder ([place])   Total Time spent with patient: 30 minutes  Musculoskeletal: Strength & Muscle Tone: within normal limits Gait & Station: normal Patient leans: N/A  Psychiatric Specialty Exam: ROS no headache, no chest pain, no shortness of breath, no cough, no vomiting   Blood pressure 93/71, pulse 68, temperature 98.1 F (36.7 C), temperature source Oral, resp. rate 18, height 5\' 4"  (1.626 m), weight 52.6 kg, last menstrual period 10/10/2011, SpO2 100 %.Body mass index is 19.91 kg/m.  General Appearance: Well Groomed  Eye Contact::  Good  Speech:  Normal Rate409  Volume:  Normal  Mood:  mood is improved, states she is feeling a lot better than on admission  Affect:  fuller in range, brighter   Thought Process:  Linear and Descriptions of Associations: Intact  Orientation:  Full (Time, Place, and Person)  Thought Content:  no hallucinations, no delusions   Suicidal Thoughts:  No denies suicidal or self injurious ideations , denies homicidal or violent ideations  Homicidal Thoughts:  No  Memory:  recent and remote grossly intact   Judgement:  Other:  improving   Insight:  improving  Psychomotor Activity:  Normal  Concentration:  Good  Recall:  Good  Fund of Knowledge:Good  Language: Good  Akathisia:  Negative  Handed:  Right  AIMS (if indicated):     Assets:  Communication Skills Desire for Improvement Resilience  Sleep:  Number of Hours: 6.25  Cognition: WNL  ADL's:  Intact   Mental Status Per Nursing Assessment::   On Admission:  Suicidal ideation indicated by patient  Demographic Factors:  53 year old female , single, currently homeless   Loss Factors: Death of loved one ( fiance) in April 2020, relapse   Historical Factors: History of depression, history of cocaine abuse, has been diagnosed with schizoaffective  disorder in the past   Risk Reduction Factors:   Sense of responsibility to family and Positive coping skills or problem solving skills  Continued Clinical Symptoms:  At this time alert and attentive, reports much improved mood , with full range of affect, no thought disorder, no suicidal or self injurious ideations, no homicidal ideations, no psychotic symptoms. Denies medication side effects. Side effects reviewed. Behavior on unit calm , pleasant on approach.   Cognitive Features That Contribute To Risk:  No gross cognitive deficits noted upon discharge. Is alert , attentive, and oriented x 3    Suicide Risk:  Mild:  Suicidal ideation of limited frequency, intensity, duration, and specificity.  There are no identifiable plans, no associated intent, mild dysphoria and related symptoms, good self-control (both objective and subjective assessment), few other risk factors, and identifiable protective factors, including available and accessible social support.  [place] Follow up on 06/10/2019.   Why: You have been accepted for treatment on Friday, 6/26 at 2:00p. Please bring your photo ID, clothing, closed toe shoes, and money.  Contact information: [place] [id number] [phone]           Plan Of Care/Follow-up recommendations:  Activity:  as tolerated  Diet:  heart healthy Tests:  NA Other:  See below  Patient is leaving unit in good spirit, reporting feeling motivated in sobriety , plans to follow up at [facility]. She has an established PCP at [facility]  Craige CottaFernando A Ewin Rehberg, MD 06/10/2019, 10:08 AM

## 2022-06-05 ENCOUNTER — Encounter (HOSPITAL_COMMUNITY): Payer: Self-pay | Admitting: Emergency Medicine

## 2022-06-05 ENCOUNTER — Other Ambulatory Visit: Payer: Self-pay

## 2022-06-05 ENCOUNTER — Emergency Department (HOSPITAL_COMMUNITY)
Admission: EM | Admit: 2022-06-05 | Discharge: 2022-06-05 | Disposition: A | Discharge status: Home / Self Care | Attending: Emergency Medicine | Admitting: Emergency Medicine

## 2022-06-05 ENCOUNTER — Emergency Department (HOSPITAL_COMMUNITY)

## 2022-06-05 DIAGNOSIS — R944 Abnormal results of kidney function studies: Secondary | ICD-10-CM | POA: Diagnosis not present

## 2022-06-05 DIAGNOSIS — R079 Chest pain, unspecified: Secondary | ICD-10-CM | POA: Diagnosis present

## 2022-06-05 LAB — CBC WITH DIFFERENTIAL/PLATELET
Abs Immature Granulocytes: 0.02 10*3/uL (ref 0.00–0.07)
Basophils Absolute: 0 10*3/uL (ref 0.0–0.1)
Basophils Relative: 0 %
Eosinophils Absolute: 0.2 10*3/uL (ref 0.0–0.5)
Eosinophils Relative: 3 %
HCT: 37.1 % (ref 36.0–46.0)
Hemoglobin: 12.4 g/dL (ref 12.0–15.0)
Immature Granulocytes: 0 %
Lymphocytes Relative: 42 %
Lymphs Abs: 2.3 10*3/uL (ref 0.7–4.0)
MCH: 29 pg (ref 26.0–34.0)
MCHC: 33.4 g/dL (ref 30.0–36.0)
MCV: 86.9 fL (ref 80.0–100.0)
Monocytes Absolute: 0.6 10*3/uL (ref 0.1–1.0)
Monocytes Relative: 11 %
Neutro Abs: 2.4 10*3/uL (ref 1.7–7.7)
Neutrophils Relative %: 44 %
Platelets: 232 10*3/uL (ref 150–400)
RBC: 4.27 MIL/uL (ref 3.87–5.11)
RDW: 13.1 % (ref 11.5–15.5)
WBC: 5.5 10*3/uL (ref 4.0–10.5)
nRBC: 0 % (ref 0.0–0.2)

## 2022-06-05 LAB — COMPREHENSIVE METABOLIC PANEL
ALT: 28 U/L (ref 0–44)
AST: 28 U/L (ref 15–41)
Albumin: 4.3 g/dL (ref 3.5–5.0)
Alkaline Phosphatase: 58 U/L (ref 38–126)
Anion gap: 7 (ref 5–15)
BUN: 20 mg/dL (ref 6–20)
CO2: 29 mmol/L (ref 22–32)
Calcium: 9.7 mg/dL (ref 8.9–10.3)
Chloride: 104 mmol/L (ref 98–111)
Creatinine, Ser: 1.06 mg/dL — ABNORMAL HIGH (ref 0.44–1.00)
GFR, Estimated: >60 mL/min (ref >60)
Glucose, Bld: 101 mg/dL — ABNORMAL HIGH (ref 70–99)
Potassium: 4.8 mmol/L (ref 3.5–5.1)
Sodium: 140 mmol/L (ref 135–145)
Total Bilirubin: 0.4 mg/dL (ref 0.3–1.2)
Total Protein: 8 g/dL (ref 6.5–8.1)

## 2022-06-05 LAB — TROPONIN I (HIGH SENSITIVITY)
Troponin I (High Sensitivity): <2 ng/L (ref <18)
Troponin I (High Sensitivity): 2 ng/L (ref <18)

## 2022-06-05 LAB — LIPASE, BLOOD: Lipase: 47 U/L (ref 11–51)

## 2022-06-05 MED ORDER — KETOROLAC TROMETHAMINE 30 MG/ML IJ SOLN
30.0000 mg | Freq: Once | INTRAMUSCULAR | Status: AC
  Administered 2022-06-05: 30 mg via INTRAVENOUS
  Filled 2022-06-05: qty 1

## 2022-06-05 NOTE — ED Triage Notes (Signed)
Pt bib by EMS with chestpain that started around 2am. Pt states pain is primarily around right shoulder blade radiating down right arm. Denies any injury. Intermittent SOB. No interventions prior to arrival.

## 2022-06-05 NOTE — Discharge Instructions (Signed)
You are seen in the emergency department for evaluation from right-sided shoulder and chest pain.  You had blood work, chest x-ray and EKG that did not show any evidence of heart injury.  This pain is likely muscular.  You can use ice to the affected area and Tylenol and ibuprofen for pain.  Follow-up with your regular doctor.  Return to the emergency department if any worsening or concerning symptoms.

## 2022-06-05 NOTE — ED Provider Notes (Signed)
[facility] EMERGENCY DEPARTMENT Provider Note   CSN: [id number] Arrival date & time: 06/05/22  1207     History  Chief Complaint  Patient presents with   Chest Pain    Amanda Keith is a 56 y.o. female.  She is here with a complaint of right scapular pain into right chest pain that started around 1 AM this morning.  Worse with movement.  She is left-hand dominant.  Denies any trauma.  No history of cardiac disease.  Has tried nothing for it.  She denies any recent drugs non-smoker  The history is provided by the patient.  Chest Pain Pain location:  R chest Pain quality: sharp and stabbing   Pain radiates to:  R shoulder Pain severity:  Moderate Duration:  10 hours Timing:  Intermittent Progression:  Unchanged Chronicity:  New Relieved by:  None tried Worsened by:  Movement Ineffective treatments:  None tried Associated symptoms: no abdominal pain, no cough, no diaphoresis, no fever, no headache, no nausea, no shortness of breath and no vomiting   Risk factors: no smoking        Home Medications Prior to Admission medications   Medication Sig Start Date End Date Taking? Authorizing Provider  albuterol (VENTOLIN HFA) 108 (90 Base) MCG/ACT inhaler Inhale 1-2 puffs into the lungs every 4 (four) hours as needed for wheezing or shortness of breath. 06/10/19   Aldean Baker, NP  gabapentin (NEURONTIN) 400 MG capsule Take 1 capsule (400 mg total) by mouth 3 (three) times daily. 06/10/19   Aldean Baker, NP  lidocaine (LIDODERM) 5 % Place 1 patch onto the skin at bedtime as needed. Remove & Discard patch within 12 hours or as directed by MD 06/10/19   Aldean Baker, NP  meloxicam (MOBIC) 15 MG tablet Take 1 tablet (15 mg total) by mouth daily. 06/11/19   Aldean Baker, NP  Multiple Vitamin (MULTIVITAMIN WITH MINERALS) TABS tablet Take 1 tablet by mouth daily. 06/11/19   Aldean Baker, NP  risperiDONE (RISPERDAL) 1 MG tablet Take 1 tablet (1 mg total) by mouth at bedtime. 06/10/19    Aldean Baker, NP  sertraline (ZOLOFT) 100 MG tablet Take 1 tablet (100 mg total) by mouth at bedtime. 06/10/19   Aldean Baker, NP  traZODone (DESYREL) 50 MG tablet Take 1 tablet (50 mg total) by mouth at bedtime as needed for sleep. 06/10/19   Aldean Baker, NP      Allergies    Patient has no known allergies.    Review of Systems   Review of Systems  Constitutional:  Negative for diaphoresis and fever.  HENT:  Negative for sore throat.   Eyes:  Negative for visual disturbance.  Respiratory:  Negative for cough and shortness of breath.   Cardiovascular:  Positive for chest pain.  Gastrointestinal:  Negative for abdominal pain, nausea and vomiting.  Genitourinary:  Negative for dysuria.  Musculoskeletal:  Negative for neck pain.  Skin:  Negative for rash.  Neurological:  Negative for headaches.    Physical Exam Updated Vital Signs BP 117/84 (BP Location: Right Arm)   Pulse 74   Temp 98.2 F (36.8 C) (Oral)   Resp 18   Ht 5\' 4"  (1.626 m)   Wt 61.2 kg   LMP 10/10/2011   SpO2 96%   BMI 23.17 kg/m  Physical Exam Vitals and nursing note reviewed.  Constitutional:      General: She is not in acute distress.  Appearance: She is well-developed.  HENT:     Head: Normocephalic and atraumatic.  Eyes:     Conjunctiva/sclera: Conjunctivae normal.  Cardiovascular:     Rate and Rhythm: Normal rate and regular rhythm.     Heart sounds: Normal heart sounds. No murmur heard. Pulmonary:     Effort: Pulmonary effort is normal. No respiratory distress.     Breath sounds: Normal breath sounds.  Abdominal:     Palpations: Abdomen is soft.     Tenderness: There is no abdominal tenderness.  Musculoskeletal:        General: No swelling. Normal range of motion.     Cervical back: Neck supple.     Right lower leg: No tenderness. No edema.     Left lower leg: No tenderness. No edema.     Comments: She has some reproducible tenderness on palpation of her left scapula and left pectoral  area  Skin:    General: Skin is warm and dry.     Capillary Refill: Capillary refill takes less than 2 seconds.  Neurological:     General: No focal deficit present.     Mental Status: She is alert.  Psychiatric:        Mood and Affect: Mood normal.     ED Results / Procedures / Treatments   Labs (all labs ordered are listed, but only abnormal results are displayed) Labs Reviewed  COMPREHENSIVE METABOLIC PANEL - Abnormal; Notable for the following components:      Result Value   Glucose, Bld 101 (*)    Creatinine, Ser 1.06 (*)    All other components within normal limits  CBC WITH DIFFERENTIAL/PLATELET  LIPASE, BLOOD  TROPONIN I (HIGH SENSITIVITY)  TROPONIN I (HIGH SENSITIVITY)    EKG EKG Interpretation  Date/Time:  Thursday June 05 2022 12:20:04 EDT Ventricular Rate:  69 PR Interval:  127 QRS Duration: 80 QT Interval:  401 QTC Calculation: 430 R Axis:   49 Text Interpretation: Sinus rhythm Minimal ST elevation, anterior leads No significant change since prior 3/18 Confirmed by Aletta Edouard [phone]) on 06/05/2022 12:24:17 PM  Radiology DG Chest Port 1 View  Result Date: 06/05/2022 CLINICAL DATA:  Chest and right scapular pain. EXAM: PORTABLE CHEST 1 VIEW COMPARISON:  Chest x-ray dated March 07, 2017. FINDINGS: The heart size and mediastinal contours are within normal limits. Both lungs are clear. The visualized skeletal structures are unremarkable. IMPRESSION: No active disease. Electronically Signed   By: Titus Dubin M.D.   On: 06/05/2022 12:43    Procedures Procedures    Medications Ordered in ED Medications  ketorolac (TORADOL) 30 MG/ML injection 30 mg (has no administration in time range)    ED Course/ Medical Decision Making/ A&P Clinical Course as of 06/05/22 1842  Thu Jun 05, 2022  1257 Chest x-ray does not show any acute pneumothorax or other findings.  Waiting radiology reading. [MB]    Clinical Course User Index [MB] Hayden Rasmussen, MD                            Medical Decision Making Amount and/or Complexity of Data Reviewed Labs: ordered. Radiology: ordered.  Risk Prescription drug management.   This patient complains of Right-sided scapular and chest pain; this involves an extensive number of treatment Options and is a complaint that carries with it a high risk of complications and morbidity. The differential includes musculoskeletal pain, pleurisy, pneumothorax, PE, ACS, pneumonia, vascular  I ordered, reviewed and interpreted labs, which included CBC with normal white count normal hemoglobin, chemistries normal other than mildly elevated creatinine, LFTs normal, troponins flat I ordered medication IV Toradol with improvement in her symptoms and reviewed PMP when indicated. I ordered imaging studies which included chest x-ray and I independently    visualized and interpreted imaging which showed no acute findings Previous records obtained and reviewed in epic no recent admissions .  Cardiac monitoring reviewed, normal sinus rhythm Social determinants considered, no significant barriers Critical Interventions: None  After the interventions stated above, I reevaluated the patient and found patient to be symptomatically improved Admission and further testing considered, no indications for admission or further work-up at this time.  Recommended symptomatic treatment and follow-up with PCP.  Return instructions discussed         Final Clinical Impression(s) / ED Diagnoses Final diagnoses:  Nonspecific chest pain    Rx / DC Orders ED Discharge Orders     None         Terrilee Files, MD 06/05/22 1844
